# Patient Record
Sex: Female | Born: 1960 | Race: Black or African American | Hispanic: No | Smoking: Never smoker
Health system: Southern US, Community
[De-identification: ages and names within clinical notes are randomized; demographics above are authoritative.]

## PROBLEM LIST (undated history)

## (undated) DIAGNOSIS — D649 Anemia, unspecified: Secondary | ICD-10-CM

## (undated) DIAGNOSIS — L0390 Cellulitis, unspecified: Secondary | ICD-10-CM

## (undated) DIAGNOSIS — A4902 Methicillin resistant Staphylococcus aureus infection, unspecified site: Secondary | ICD-10-CM

## (undated) DIAGNOSIS — E079 Disorder of thyroid, unspecified: Secondary | ICD-10-CM

## (undated) DIAGNOSIS — G2581 Restless legs syndrome: Secondary | ICD-10-CM

## (undated) DIAGNOSIS — M797 Fibromyalgia: Secondary | ICD-10-CM

## (undated) DIAGNOSIS — I10 Essential (primary) hypertension: Secondary | ICD-10-CM

## (undated) DIAGNOSIS — J45909 Unspecified asthma, uncomplicated: Secondary | ICD-10-CM

## (undated) DIAGNOSIS — G4730 Sleep apnea, unspecified: Secondary | ICD-10-CM

## (undated) DIAGNOSIS — E039 Hypothyroidism, unspecified: Secondary | ICD-10-CM

## (undated) DIAGNOSIS — J189 Pneumonia, unspecified organism: Secondary | ICD-10-CM

## (undated) DIAGNOSIS — Z9109 Other allergy status, other than to drugs and biological substances: Secondary | ICD-10-CM

## (undated) HISTORY — PX: JOINT REPLACEMENT: SHX530

## (undated) HISTORY — PX: GASTRIC BYPASS: SHX52

## (undated) HISTORY — PX: BACK SURGERY: SHX140

---

## 2006-05-24 ENCOUNTER — Other Ambulatory Visit: Admission: RE | Admit: 2006-05-24 | Discharge: 2006-05-24 | Payer: Self-pay | Admitting: Obstetrics and Gynecology

## 2006-06-21 ENCOUNTER — Encounter: Admission: RE | Admit: 2006-06-21 | Discharge: 2006-06-21 | Payer: Self-pay | Admitting: Obstetrics and Gynecology

## 2006-07-08 ENCOUNTER — Inpatient Hospital Stay (HOSPITAL_COMMUNITY): Admission: AD | Admit: 2006-07-08 | Discharge: 2006-07-08 | Payer: Self-pay | Admitting: Obstetrics and Gynecology

## 2006-10-24 ENCOUNTER — Encounter: Admission: RE | Admit: 2006-10-24 | Discharge: 2006-10-24 | Payer: Self-pay | Admitting: Orthopedic Surgery

## 2006-11-19 ENCOUNTER — Inpatient Hospital Stay (HOSPITAL_COMMUNITY): Admission: RE | Admit: 2006-11-19 | Discharge: 2006-11-21 | Payer: Self-pay | Admitting: Neurosurgery

## 2008-03-27 ENCOUNTER — Emergency Department (HOSPITAL_BASED_OUTPATIENT_CLINIC_OR_DEPARTMENT_OTHER): Admission: EM | Admit: 2008-03-27 | Discharge: 2008-03-27 | Payer: Self-pay | Admitting: Emergency Medicine

## 2010-05-09 ENCOUNTER — Encounter: Admission: RE | Admit: 2010-05-09 | Discharge: 2010-05-09 | Payer: Self-pay | Admitting: Internal Medicine

## 2010-05-19 ENCOUNTER — Encounter
Admission: RE | Admit: 2010-05-19 | Discharge: 2010-05-19 | Payer: Self-pay | Source: Home / Self Care | Attending: Internal Medicine | Admitting: Internal Medicine

## 2010-10-24 NOTE — Op Note (Signed)
NAMENORVELL, URESTE                ACCOUNT NO.:  [id number]   MEDICAL RECORD NO.:  [id number]          PATIENT TYPE:  INP   LOCATION:  2899                         FACILITY:  [facility]   PHYSICIAN:  Danae Orleans. Venetia Maxon, M.D.  DATE OF BIRTH:  1961-01-20   DATE OF PROCEDURE:  11/19/2006  DATE OF DISCHARGE:                               OPERATIVE REPORT   PREOPERATIVE DIAGNOSIS:  Herniated cervical disk with cervical  myelopathy, spondylosis with myelopathy, stenosis, cervical  radiculopathy C3-4, C4-5, C5-6 and C6-7 levels.   POSTOPERATIVE DIAGNOSIS:  Herniated cervical disk with cervical  myelopathy, spondylosis with myelopathy, stenosis, cervical  radiculopathy C3-4, C4-5, C5-6 and C6-7 levels.   PROCEDURE:  Anterior cervical decompression and fusion C3-4, C4-5, C5-6,  C6-7 levels with PEEK interbody cages, morcellized bone autograft and  Osteocell with the anterior cervical plate.   SURGEON:  Danae Orleans. Venetia Maxon, M.D.   ASSISTANT:  Georgiann Cocker, RN; Clydene Fake, M.D.   ANESTHESIA:  General endotracheal anesthesia.   BLOOD LOSS:  200 mL   COMPLICATIONS:  None.   DISPOSITION:  Recovery.   INDICATIONS:  Tonya Webster is a 50 year old woman with herniated  cervical disk and cervical myelopathy at C3-4, C4-5, C5-6, and C6-7  levels.  It was elected to take her surgery for anterior cervical  decompression and fusion at these affected levels.   PROCEDURE:  Tonya Webster was brought to the operating room.  Following  satisfactory uncomplicated induction of general endotracheal anesthesia  and placement of intravenous lines, she was placed in supine position on  the operating table.  Her neck was maintained in neutral alignment.  Her  anterior neck was then prepped and draped in the usual sterile fashion.  Area of planned incision was infiltrated with 0.25% Marcaine and 0.5%  lidocaine with 1:200,000 epinephrine.  Incision was made from the  anterior border of sternocleidomastoid muscle  to the midline at the  level of the carotid tubercle on the left side of midline.  This  incision was carried through platysma layer.  Subplatysmal dissection  was performed exposing the anterior border sternocleidomastoid muscle  using blunt dissection, the carotid sheath kept lateral and trachea and  esophagus kept medial exposing anterior cervical spine.  A bent spinal  needle was placed at what was felt to be the C3-4 and C4-5 levels and  this was confirmed on intraoperative x-ray.  Subsequently the longus  colli muscles were taken down from the anterior cervical spine from C3-  C7 bilaterally using electrocautery and Key elevator.  Self-retaining  shadow line retractors placed facilitate exposure.  Initially at C3-4,  C4-5 level, these levels were incised and disk material was removed in  piecemeal fashion and endplates were stripped of residual disk material.  Caspar distraction system was used and initially at each of C3-4, C4-5  levels.  The  interspaces were cleared of residual disk material.  The  endplates were drilled with high-speed drill and the bone removed along  with the anterior osteophytes.  This was kept for later use with bone  grafting.  Subsequently a large amount of  central disk material,  herniated disk material was removed at the C3-4 level.  The central  spinal cord dura was decompressed bilaterally in cephalocaudal fashion  and appeared to be significant decompression of spinal cord.  This was  done under microscopic visualization.  Subsequently after hemostasis was  assured with Gelfoam soaked thrombin and after trial sizing a 6-mm  medium PEEK interbody cage was selected, packed with morselized bone  autograft and Osteocell, inserted interspace and countersunk  appropriately.  Attention was then turned to the C4-5 level similar  decompression was performed.  Central spinal cord dura was decompressed  as were the neural foramina bilaterally.  Hemostasis was  again assured  and a similarly sized PEEK cage was packed with morselized bone  autograft and Osteocell inserted interspace countersunk appropriately.  Attention was then turned to the C6-7 level where similar decompression  was performed.  Large uncinate spurs were drilled down and both C7 nerve  roots as well as central spinal cord dura was decompressed.  Hemostasis  was assured and similarly sized interbody cage was selected, packed with  morcellized bone autograft, Osteocell inserted interspace countersunk  appropriately.  At the C5-6 level there was a significant amount of cord  compression on the left side of midline by large osteophyte.  These were  drilled down and thinned and then elevated with a variety of curettes as  well as 1 mm gold tip Kerrison rongeurs.  Both the central spinal cord  dura, both the C6 neural foramina and also the were decompressed and the  dura appeared to rebound nicely following removal of these large  adherent bone spurs.  After hemostasis assured a similarly sized graft  was packed with morcellized bone autograft, Osteocell inserted in the  interspace and countersunk appropriately.  A four level Trestle anterior  cervical plate was then affixed to the anterior cervical spine after  traction weight was removed.  Two 14 mm variable angle screws placed at  C3, two at C4, two at C5, two at C6, two at C7, all screws had excellent  purchase.  Locking mechanisms were engaged.  Final x-ray demonstrated  the upper aspect of the construct because the patient's large body  habitus.  Hemostasis was assured.  The wound was irrigated.  A #7 Blake  drain was placed through separate stab incision.  The platysma was  anchored with nylon stitch.  The platysmal layer was closed with 3-0  Vicryl sutures.  Skin edges were approximated 3-0 Vicryl interrupted  inverted sutures.  Wound was dressed with Benzoin, Steri-Strips, Telfa, gauze and tape.  The patient was extubated in  the operating room, taken  to the recovery room in stable satisfactory condition, having tolerated  the operation well.  Counts correct at end the case.      Danae Orleans. Venetia Maxon, M.D.  Electronically Signed     JDS/MEDQ  D:  11/19/2006  T:  11/20/2006  Job:  [id number]

## 2010-11-22 ENCOUNTER — Other Ambulatory Visit (HOSPITAL_COMMUNITY)
Admission: RE | Admit: 2010-11-22 | Discharge: 2010-11-22 | Disposition: A | Discharge status: Home / Self Care | Payer: 59 | Source: Ambulatory Visit | Attending: Family Medicine | Admitting: Family Medicine

## 2010-11-22 DIAGNOSIS — Z124 Encounter for screening for malignant neoplasm of cervix: Secondary | ICD-10-CM | POA: Insufficient documentation

## 2011-03-13 LAB — URINALYSIS, ROUTINE W REFLEX MICROSCOPIC
Bilirubin Urine: NEGATIVE
Glucose, UA: NEGATIVE
Ketones, ur: NEGATIVE
Nitrite: NEGATIVE
Protein, ur: NEGATIVE
Specific Gravity, Urine: 1.007
Urobilinogen, UA: 0.2
pH: 6.5

## 2011-03-13 LAB — DIFFERENTIAL
Basophils Absolute: 0.2 — ABNORMAL HIGH
Basophils Relative: 2 — ABNORMAL HIGH
Eosinophils Absolute: 0.1
Monocytes Absolute: 0.6
Monocytes Relative: 5
Neutrophils Relative %: 77

## 2011-03-13 LAB — COMPREHENSIVE METABOLIC PANEL
ALT: <6
Albumin: 4.1
Alkaline Phosphatase: 113
Chloride: 108
Glucose, Bld: 79
Potassium: 3.9
Sodium: 142
Total Bilirubin: 0.3
Total Protein: 6.9

## 2011-03-13 LAB — GLUCOSE, CAPILLARY: Glucose-Capillary: 85

## 2011-03-13 LAB — PROTIME-INR
INR: 1.1
Prothrombin Time: 14.7

## 2011-03-13 LAB — CBC
HCT: 36.7
Hemoglobin: 11.9 — ABNORMAL LOW
MCHC: 32.5
MCV: 88.3
Platelets: 242
RBC: 4.15
RDW: 13.2
WBC: 11.4 — ABNORMAL HIGH

## 2011-03-13 LAB — POCT CARDIAC MARKERS
CKMB, poc: 1.8
Troponin i, poc: <0.05

## 2011-03-13 LAB — URINE MICROSCOPIC-ADD ON

## 2011-03-13 LAB — PREGNANCY, URINE: Preg Test, Ur: NEGATIVE

## 2011-03-13 LAB — LIPASE, BLOOD: Lipase: 98

## 2011-03-13 LAB — APTT: aPTT: 36

## 2011-03-29 LAB — BASIC METABOLIC PANEL
BUN: 8
CO2: 29
Calcium: 9
Creatinine, Ser: 0.67
Glucose, Bld: 92

## 2011-03-29 LAB — CBC
Hemoglobin: 12.6
MCHC: 33.3
RDW: 13.4

## 2011-12-19 ENCOUNTER — Other Ambulatory Visit: Payer: Self-pay | Admitting: Physician Assistant

## 2011-12-19 ENCOUNTER — Other Ambulatory Visit: Payer: Self-pay | Admitting: Family Medicine

## 2011-12-19 DIAGNOSIS — Z1231 Encounter for screening mammogram for malignant neoplasm of breast: Secondary | ICD-10-CM

## 2011-12-24 ENCOUNTER — Ambulatory Visit
Admission: RE | Admit: 2011-12-24 | Discharge: 2011-12-24 | Disposition: A | Discharge status: Home / Self Care | Payer: 59 | Source: Ambulatory Visit | Attending: Family Medicine | Admitting: Family Medicine

## 2011-12-24 DIAGNOSIS — Z1231 Encounter for screening mammogram for malignant neoplasm of breast: Secondary | ICD-10-CM

## 2012-07-11 ENCOUNTER — Other Ambulatory Visit: Payer: Self-pay | Admitting: Physician Assistant

## 2012-07-11 ENCOUNTER — Other Ambulatory Visit (HOSPITAL_COMMUNITY)
Admission: RE | Admit: 2012-07-11 | Discharge: 2012-07-11 | Disposition: A | Discharge status: Home / Self Care | Payer: 59 | Source: Ambulatory Visit | Attending: Family Medicine | Admitting: Family Medicine

## 2012-07-11 DIAGNOSIS — Z124 Encounter for screening for malignant neoplasm of cervix: Secondary | ICD-10-CM | POA: Insufficient documentation

## 2013-07-20 ENCOUNTER — Other Ambulatory Visit: Payer: Self-pay | Admitting: Physician Assistant

## 2013-07-20 ENCOUNTER — Other Ambulatory Visit (HOSPITAL_COMMUNITY)
Admission: RE | Admit: 2013-07-20 | Discharge: 2013-07-20 | Disposition: A | Discharge status: Home / Self Care | Payer: 59 | Source: Ambulatory Visit | Attending: Family Medicine | Admitting: Family Medicine

## 2013-07-20 DIAGNOSIS — Z124 Encounter for screening for malignant neoplasm of cervix: Secondary | ICD-10-CM | POA: Insufficient documentation

## 2013-08-10 ENCOUNTER — Other Ambulatory Visit: Payer: Self-pay

## 2013-08-10 DIAGNOSIS — Z1231 Encounter for screening mammogram for malignant neoplasm of breast: Secondary | ICD-10-CM

## 2013-08-11 ENCOUNTER — Ambulatory Visit
Admission: RE | Admit: 2013-08-11 | Discharge: 2013-08-11 | Disposition: A | Discharge status: Home / Self Care | Payer: 59 | Source: Ambulatory Visit

## 2013-08-11 DIAGNOSIS — Z1231 Encounter for screening mammogram for malignant neoplasm of breast: Secondary | ICD-10-CM

## 2014-03-31 ENCOUNTER — Encounter (HOSPITAL_BASED_OUTPATIENT_CLINIC_OR_DEPARTMENT_OTHER): Payer: Self-pay | Admitting: Emergency Medicine

## 2014-03-31 ENCOUNTER — Emergency Department (HOSPITAL_BASED_OUTPATIENT_CLINIC_OR_DEPARTMENT_OTHER)
Admission: EM | Admit: 2014-03-31 | Discharge: 2014-03-31 | Disposition: A | Discharge status: Home / Self Care | Payer: 59 | Attending: Emergency Medicine | Admitting: Emergency Medicine

## 2014-03-31 DIAGNOSIS — J45909 Unspecified asthma, uncomplicated: Secondary | ICD-10-CM | POA: Diagnosis not present

## 2014-03-31 DIAGNOSIS — Z7951 Long term (current) use of inhaled steroids: Secondary | ICD-10-CM | POA: Diagnosis not present

## 2014-03-31 DIAGNOSIS — L03115 Cellulitis of right lower limb: Secondary | ICD-10-CM | POA: Diagnosis not present

## 2014-03-31 DIAGNOSIS — Z79899 Other long term (current) drug therapy: Secondary | ICD-10-CM | POA: Diagnosis not present

## 2014-03-31 DIAGNOSIS — L089 Local infection of the skin and subcutaneous tissue, unspecified: Secondary | ICD-10-CM | POA: Diagnosis present

## 2014-03-31 DIAGNOSIS — E079 Disorder of thyroid, unspecified: Secondary | ICD-10-CM | POA: Insufficient documentation

## 2014-03-31 DIAGNOSIS — Z9884 Bariatric surgery status: Secondary | ICD-10-CM | POA: Insufficient documentation

## 2014-03-31 DIAGNOSIS — Z8614 Personal history of Methicillin resistant Staphylococcus aureus infection: Secondary | ICD-10-CM | POA: Insufficient documentation

## 2014-03-31 DIAGNOSIS — M797 Fibromyalgia: Secondary | ICD-10-CM | POA: Diagnosis not present

## 2014-03-31 DIAGNOSIS — R509 Fever, unspecified: Secondary | ICD-10-CM | POA: Insufficient documentation

## 2014-03-31 HISTORY — DX: Fibromyalgia: M79.7

## 2014-03-31 HISTORY — DX: Disorder of thyroid, unspecified: E07.9

## 2014-03-31 HISTORY — DX: Unspecified asthma, uncomplicated: J45.909

## 2014-03-31 HISTORY — DX: Other allergy status, other than to drugs and biological substances: Z91.09

## 2014-03-31 HISTORY — DX: Methicillin resistant Staphylococcus aureus infection, unspecified site: A49.02

## 2014-03-31 MED ORDER — CLINDAMYCIN HCL 150 MG PO CAPS: 450.0000 mg | ORAL_CAPSULE | Freq: Three times a day (TID) | ORAL | Status: DC

## 2014-03-31 MED ORDER — CLINDAMYCIN HCL 150 MG PO CAPS
450.0000 mg | ORAL_CAPSULE | Freq: Once | ORAL | Status: AC
  Administered 2014-03-31: 450 mg via ORAL
  Filled 2014-03-31: qty 3

## 2014-03-31 NOTE — ED Provider Notes (Signed)
CSN: [id number]     Arrival date & time 03/31/14  1948 History  This chart was scribed for Tonya Delay, MD by Randa Evens, ED Scribe. This patient was seen in room MH06/MH06 and the patient's care was started at 8:11 PM.      Chief Complaint  Patient presents with  . Wound Infection   Patient is a 53 y.o. female presenting with wound check. The history is provided by the patient. No language interpreter was used.  Wound Check This is a recurrent problem. The current episode started more than 2 days ago. The problem occurs rarely. The problem has been gradually worsening. Nothing aggravates the symptoms. Nothing relieves the symptoms. She has tried a warm compress for the symptoms.   HPI Comments: Tonya Webster is a 53 y.o. female who presents to the Emergency Department complaining of wound infection to the back of her right thigh onset 5-6 days ago. She states she has had associated subjective fever, chills, color change and swelling and discharge. She states she has had nausea and diarrhea that has now resolved. She states she has tried applying warm compress and it has helped. Denies abdominal pain or other related symptoms.   Past Medical History  Diagnosis Date  . MRSA infection   . Fibromyalgia   . Thyroid disease   . Asthma   . Environmental allergies    Past Surgical History  Procedure Laterality Date  . Back surgery    . Gastric bypass    . Cesarean section     No family history on file. History  Substance Use Topics  . Smoking status: Never Smoker   . Smokeless tobacco: Not on file  . Alcohol Use: No   OB History   Grav Para Term Preterm Abortions TAB SAB Ect Mult Living                 Review of Systems  All other systems reviewed and are negative.     Allergies  Soy allergy  Home Medications   Prior to Admission medications   Medication Sig Start Date End Date Taking? Authorizing Provider  acetaminophen (TYLENOL) 500 MG tablet Take 500 mg by  mouth every 6 (six) hours as needed.   Yes Historical Provider, MD  Albuterol (VENTOLIN IN) Inhale into the lungs.   Yes Historical Provider, MD  BuPROPion HCl (WELLBUTRIN PO) Take by mouth.   Yes Historical Provider, MD  Cetirizine HCl (ZYRTEC PO) Take by mouth.   Yes Historical Provider, MD  Fluticasone Propionate (FLONASE NA) Place into the nose.   Yes Historical Provider, MD  GABAPENTIN PO Take by mouth.   Yes Historical Provider, MD  LEVOTHYROXINE SODIUM PO Take by mouth.   Yes Historical Provider, MD  OXYBUTYNIN CHLORIDE PO Take by mouth.   Yes Historical Provider, MD   Triage Vitals: BP 146/72  Pulse 80  Temp(Src) 98 F (36.7 C) (Oral)  Resp 20  Ht 5\' 4"  (1.626 m)  Wt 230 lb (104.327 kg)  BMI 39.46 kg/m2  SpO2 99%  Physical Exam  Nursing note and vitals reviewed. Constitutional: She is oriented to person, place, and time. She appears well-developed and well-nourished. No distress.  HENT:  Head: Normocephalic and atraumatic.  Eyes: Conjunctivae are normal. No scleral icterus.  Neck: Neck supple.  Cardiovascular: Normal rate and intact distal pulses.   Pulmonary/Chest: Effort normal. No stridor. No respiratory distress.  Abdominal: Normal appearance. She exhibits no distension.  Neurological: She is alert and oriented  to person, place, and time.  Skin: Skin is warm and dry. No rash noted.     Psychiatric: She has a normal mood and affect. Her behavior is normal.    ED Course  Procedures (including critical care time)  EMERGENCY DEPARTMENT US SOFT TISSUE INTERPRETATION "Study: Limited Soft Tissue Ultrasound"  INDICATIONS: Soft tissue infection Multiple views of the body part were obtained in real-time with a multi-frequency linear probe PERFORMED BY:  Myself IMAGES ARCHIVED?: Yes SIDE:Right  BODY PART:Lower extremity FINDINGS: No abcess noted and Cellulitis present INTERPRETATION:  No abcess noted and Cellulitis present     DIAGNOSTIC STUDIES: Oxygen  Saturation is 99% on RA, normal by my interpretation.    COORDINATION OF CARE: 8:46 PM-Discussed treatment plan which includes antibiotics with pt at bedside and pt agreed to plan.     Labs Review Labs Reviewed - No data to display  Imaging Review No results found.   EKG Interpretation None      MDM   Final diagnoses:  Cellulitis of right leg      53 yo female with a history of recurrent skin infections who presents with a sore on the back of her right leg. Has been spontaneously draining since she started using warm compresses. On exam, large ulceration without active drainage. Bedside ultrasound was used to confirm no underlying fluid collection. It does not appear that the wound needs to be opened further. She does however need antibiotics. Have started clindamycin, which she has tolerated in the past. She is nontoxic, not septic appearing.  I personally performed the services described in this documentation, which was scribed in my presence. The recorded information has been reviewed and is accurate.      Tonya Delay, MD 03/31/14 2109

## 2014-03-31 NOTE — ED Notes (Signed)
Dressing to right posterior abscess, covered with 4 x 4, wrapped with kerlix and  Secured with coban

## 2014-03-31 NOTE — Discharge Instructions (Signed)

## 2014-03-31 NOTE — ED Notes (Signed)
Pt states "i got a wound on the back of my leg" x 5-6 days

## 2014-03-31 NOTE — ED Notes (Signed)
Ultrasound placed in room

## 2014-08-05 ENCOUNTER — Other Ambulatory Visit (HOSPITAL_COMMUNITY)
Admission: RE | Admit: 2014-08-05 | Discharge: 2014-08-05 | Disposition: A | Discharge status: Home / Self Care | Payer: 59 | Source: Ambulatory Visit | Attending: Family Medicine | Admitting: Family Medicine

## 2014-08-05 ENCOUNTER — Other Ambulatory Visit: Payer: Self-pay | Admitting: Physician Assistant

## 2014-08-05 DIAGNOSIS — Z124 Encounter for screening for malignant neoplasm of cervix: Secondary | ICD-10-CM | POA: Diagnosis present

## 2014-08-06 LAB — CYTOLOGY - PAP

## 2014-09-01 ENCOUNTER — Other Ambulatory Visit: Payer: Self-pay

## 2014-09-01 DIAGNOSIS — Z1231 Encounter for screening mammogram for malignant neoplasm of breast: Secondary | ICD-10-CM

## 2014-09-03 ENCOUNTER — Ambulatory Visit
Admission: RE | Admit: 2014-09-03 | Discharge: 2014-09-03 | Disposition: A | Discharge status: Home / Self Care | Payer: 59 | Source: Ambulatory Visit

## 2014-09-03 DIAGNOSIS — Z1231 Encounter for screening mammogram for malignant neoplasm of breast: Secondary | ICD-10-CM

## 2015-01-21 ENCOUNTER — Emergency Department (HOSPITAL_COMMUNITY)
Admission: EM | Admit: 2015-01-21 | Discharge: 2015-01-22 | Disposition: A | Discharge status: Home / Self Care | Payer: 59 | Attending: Emergency Medicine | Admitting: Emergency Medicine

## 2015-01-21 ENCOUNTER — Encounter (HOSPITAL_COMMUNITY): Payer: Self-pay | Admitting: *Deleted

## 2015-01-21 DIAGNOSIS — Y998 Other external cause status: Secondary | ICD-10-CM | POA: Diagnosis not present

## 2015-01-21 DIAGNOSIS — Y9289 Other specified places as the place of occurrence of the external cause: Secondary | ICD-10-CM | POA: Diagnosis not present

## 2015-01-21 DIAGNOSIS — Y9389 Activity, other specified: Secondary | ICD-10-CM | POA: Insufficient documentation

## 2015-01-21 DIAGNOSIS — M797 Fibromyalgia: Secondary | ICD-10-CM | POA: Diagnosis not present

## 2015-01-21 DIAGNOSIS — J45909 Unspecified asthma, uncomplicated: Secondary | ICD-10-CM | POA: Insufficient documentation

## 2015-01-21 DIAGNOSIS — Z79899 Other long term (current) drug therapy: Secondary | ICD-10-CM | POA: Diagnosis not present

## 2015-01-21 DIAGNOSIS — E079 Disorder of thyroid, unspecified: Secondary | ICD-10-CM | POA: Diagnosis not present

## 2015-01-21 DIAGNOSIS — Z8614 Personal history of Methicillin resistant Staphylococcus aureus infection: Secondary | ICD-10-CM | POA: Insufficient documentation

## 2015-01-21 DIAGNOSIS — T781XXA Other adverse food reactions, not elsewhere classified, initial encounter: Secondary | ICD-10-CM | POA: Diagnosis present

## 2015-01-21 DIAGNOSIS — X58XXXA Exposure to other specified factors, initial encounter: Secondary | ICD-10-CM | POA: Diagnosis not present

## 2015-01-21 DIAGNOSIS — T7840XA Allergy, unspecified, initial encounter: Secondary | ICD-10-CM

## 2015-01-21 MED ORDER — FAMOTIDINE IN NACL 20-0.9 MG/50ML-% IV SOLN
20.0000 mg | Freq: Once | INTRAVENOUS | Status: AC
  Administered 2015-01-21: 20 mg via INTRAVENOUS
  Filled 2015-01-21: qty 50

## 2015-01-21 MED ORDER — METHYLPREDNISOLONE SODIUM SUCC 125 MG IJ SOLR
125.0000 mg | Freq: Once | INTRAMUSCULAR | Status: AC
  Administered 2015-01-21: 125 mg via INTRAVENOUS
  Filled 2015-01-21: qty 2

## 2015-01-21 MED ORDER — DIPHENHYDRAMINE HCL 50 MG/ML IJ SOLN
25.0000 mg | Freq: Once | INTRAMUSCULAR | Status: AC
  Administered 2015-01-21: 25 mg via INTRAVENOUS
  Filled 2015-01-21: qty 1

## 2015-01-21 NOTE — ED Notes (Signed)
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  She has had a swollen upper lip since 1700. She took 50mg  of benadryl approx 1900.   Rt sided upper lip  Initially and now she has swelling to the lt upper  Lip on the lt and up into her lt face.

## 2015-01-21 NOTE — ED Provider Notes (Signed)
CSN: [id number]     Arrival date & time 01/21/15  2143 History  This chart was scribed for Delora Fuel, MD by Eustaquio Maize, ED Scribe. This patient was seen in room B19C/B19C and the patient's care was started at 11:08 PM.  Chief Complaint  Patient presents with  . Allergic Reaction   The history is provided by the patient. No language interpreter was used.     HPI Comments: Tonya Webster is a 54 y.o. female who presents to the Emergency Department complaining of allergic reaction s/p eating eggs and cookies earlier tonight. Pt states that she was eating eggs when she states the fork was irritating her lip. She decided to wash her mouth out with peroxide and then proceeded to eat cookies. She notes swelling to her upper lip immediately after eating the cookies. Pt took Benadryl without relief, prompting her to come to the ED. She also notes an itching sensation to her back and bilateral arms. Denies difficulty breathing, difficulty swallowing, rash, or any other associated symptoms.   Past Medical History  Diagnosis Date  . MRSA infection   . Fibromyalgia   . Thyroid disease   . Asthma   . Environmental allergies    Past Surgical History  Procedure Laterality Date  . Back surgery    . Gastric bypass    . Cesarean section     No family history on file. Social History  Substance Use Topics  . Smoking status: Never Smoker   . Smokeless tobacco: None  . Alcohol Use: No   OB History    No data available     Review of Systems  HENT: Positive for facial swelling. Negative for trouble swallowing.   Respiratory: Negative for chest tightness, shortness of breath and wheezing.   Skin: Negative for rash.  All other systems reviewed and are negative.  Allergies  Soy allergy  Home Medications   Prior to Admission medications   Medication Sig Start Date End Date Taking? Authorizing Provider  acetaminophen (TYLENOL) 500 MG tablet Take 500 mg by mouth every 6 (six) hours as  needed.    Historical Provider, MD  Albuterol (VENTOLIN IN) Inhale into the lungs.    Historical Provider, MD  BuPROPion HCl (WELLBUTRIN PO) Take by mouth.    Historical Provider, MD  Cetirizine HCl (ZYRTEC PO) Take by mouth.    Historical Provider, MD  clindamycin (CLEOCIN) 150 MG capsule Take 3 capsules (450 mg total) by mouth 3 (three) times daily. 03/31/14   Serita Grit, MD  Fluticasone Propionate (FLONASE NA) Place into the nose.    Historical Provider, MD  GABAPENTIN PO Take by mouth.    Historical Provider, MD  LEVOTHYROXINE SODIUM PO Take by mouth.    Historical Provider, MD  OXYBUTYNIN CHLORIDE PO Take by mouth.    Historical Provider, MD   Triage Vitals: BP 184/46 mmHg  Pulse 78  Temp(Src) 98.7 F (37.1 C) (Oral)  Resp 12  Ht 5\' 4"  (1.626 m)  Wt 248 lb 1 oz (112.52 kg)  BMI 42.56 kg/m2  SpO2 98%   Physical Exam  Constitutional: She is oriented to person, place, and time. She appears well-developed and well-nourished. No distress.  HENT:  Head: Normocephalic and atraumatic.  Swelling of the right side of the upper lip and across the lower lip  No swelling of the tongue, sublingual tissue, or oropharynx No difficulty with secretions Normal phonation  Eyes: Conjunctivae and EOM are normal. Pupils are equal, round, and reactive  to light.  Neck: Normal range of motion. Neck supple. No JVD present.  Cardiovascular: Normal rate, regular rhythm and normal heart sounds.   No murmur heard. Pulmonary/Chest: Effort normal and breath sounds normal. She has no wheezes. She has no rales. She exhibits no tenderness.  Abdominal: Soft. Bowel sounds are normal. She exhibits no distension and no mass. There is no tenderness.  Musculoskeletal: Normal range of motion. She exhibits no edema or tenderness.  Lymphadenopathy:    She has no cervical adenopathy.  Neurological: She is alert and oriented to person, place, and time. No cranial nerve deficit. She exhibits normal muscle tone.  Coordination normal.  Skin: Skin is warm and dry. No rash noted.  Psychiatric: She has a normal mood and affect. Her behavior is normal. Judgment and thought content normal.  Nursing note and vitals reviewed.   ED Course  Procedures (including critical care time)  DIAGNOSTIC STUDIES: Oxygen Saturation is 98% on RA, normal by my interpretation.    COORDINATION OF CARE: 11:14 PM-Discussed treatment plan which includes Benadryl, Pepcid, and Solumedrol in ED with pt at bedside and pt agreed to plan.    MDM   Final diagnoses:  Allergic reaction, initial encounter    Apparent allergic reaction. Although the only swelling is seen to the lips, generalized itching would argue against angioedema. There is no respiratory compromise. She will be treated with diphenhydramine, famotidine, and methylprednisolone and observed in the ED. At this point, no indication for epinephrine.   She had good relief of symptoms with above noted treatment. She was observed in the ED and lip swelling went down significantly but not to baseline. She is advised to use over-the-counter nonsedating any histamines and use Benadryl as needed for breakthrough itching. She is given a prescription for prednisone.  I personally performed the services described in this documentation, which was scribed in my presence. The recorded information has been reviewed and is accurate.       Delora Fuel, MD 85/92/92 [id number]

## 2015-01-21 NOTE — ED Notes (Signed)
Patient presents stating she is having an allergic reaction.  Top lip swollen and states her face is feeling tight.  Took 2 Benadryl at home to "help slow it down"

## 2015-01-21 NOTE — ED Notes (Signed)
She is also c/o itching

## 2015-01-22 MED ORDER — DIPHENHYDRAMINE HCL 50 MG/ML IJ SOLN
25.0000 mg | Freq: Once | INTRAMUSCULAR | Status: AC
  Administered 2015-01-22: 25 mg via INTRAVENOUS
  Filled 2015-01-22: qty 1

## 2015-01-22 MED ORDER — PREDNISONE 50 MG PO TABS: 50.0000 mg | ORAL_TABLET | Freq: Every day | ORAL | Status: DC

## 2015-01-22 MED ORDER — LORAZEPAM 1 MG PO TABS: 1.0000 mg | ORAL_TABLET | Freq: Four times a day (QID) | ORAL | Status: DC | PRN

## 2015-01-22 MED ORDER — LORAZEPAM 2 MG/ML IJ SOLN
1.0000 mg | Freq: Once | INTRAMUSCULAR | Status: AC
  Administered 2015-01-22: 1 mg via INTRAVENOUS
  Filled 2015-01-22: qty 1

## 2015-01-22 NOTE — ED Notes (Signed)
Dr. Glick at bedside.  

## 2015-01-22 NOTE — ED Notes (Signed)
Pt verbalized understanding of d/c instructions and has no further questions. Pt stable and NAD.  

## 2015-01-22 NOTE — Discharge Instructions (Signed)
Take Zyrtec 10 mg once a day. Take diphenhydramine (Benadryl) as needed for itching not relieved by Zyrtec.   Allergies Allergies may happen from anything your body is sensitive to. This may be food, medicines, pollens, chemicals, and nearly anything around you in everyday life that produces allergens. An allergen is anything that causes an allergy producing substance. Heredity is often a factor in causing these problems. This means you may have some of the same allergies as your parents. Food allergies happen in all age groups. Food allergies are some of the most severe and life threatening. Some common food allergies are cow's milk, seafood, eggs, nuts, wheat, and soybeans. SYMPTOMS   Swelling around the mouth.  An itchy red rash or hives.  Vomiting or diarrhea.  Difficulty breathing. SEVERE ALLERGIC REACTIONS ARE LIFE-THREATENING. This reaction is called anaphylaxis. It can cause the mouth and throat to swell and cause difficulty with breathing and swallowing. In severe reactions only a trace amount of food (for example, peanut oil in a salad) may cause death within seconds. Seasonal allergies occur in all age groups. These are seasonal because they usually occur during the same season every year. They may be a reaction to molds, grass pollens, or tree pollens. Other causes of problems are house dust mite allergens, pet dander, and mold spores. The symptoms often consist of nasal congestion, a runny itchy nose associated with sneezing, and tearing itchy eyes. There is often an associated itching of the mouth and ears. The problems happen when you come in contact with pollens and other allergens. Allergens are the particles in the air that the body reacts to with an allergic reaction. This causes you to release allergic antibodies. Through a chain of events, these eventually cause you to release histamine into the blood stream. Although it is meant to be protective to the body, it is this release  that causes your discomfort. This is why you were given anti-histamines to feel better. If you are unable to pinpoint the offending allergen, it may be determined by skin or blood testing. Allergies cannot be cured but can be controlled with medicine. Hay fever is a collection of all or some of the seasonal allergy problems. It may often be treated with simple over-the-counter medicine such as diphenhydramine. Take medicine as directed. Do not drink alcohol or drive while taking this medicine. Check with your caregiver or package insert for child dosages. If these medicines are not effective, there are many new medicines your caregiver can prescribe. Stronger medicine such as nasal spray, eye drops, and corticosteroids may be used if the first things you try do not work well. Other treatments such as immunotherapy or desensitizing injections can be used if all else fails. Follow up with your caregiver if problems continue. These seasonal allergies are usually not life threatening. They are generally more of a nuisance that can often be handled using medicine. HOME CARE INSTRUCTIONS   If unsure what causes a reaction, keep a diary of foods eaten and symptoms that follow. Avoid foods that cause reactions.  If hives or rash are present:  Take medicine as directed.  You may use an over-the-counter antihistamine (diphenhydramine) for hives and itching as needed.  Apply cold compresses (cloths) to the skin or take baths in cool water. Avoid hot baths or showers. Heat will make a rash and itching worse.  If you are severely allergic:  Following a treatment for a severe reaction, hospitalization is often required for closer follow-up.  Wear a  medic-alert bracelet or necklace stating the allergy.  You and your family must learn how to give adrenaline or use an anaphylaxis kit.  If you have had a severe reaction, always carry your anaphylaxis kit or EpiPen with you. Use this medicine as directed by  your caregiver if a severe reaction is occurring. Failure to do so could have a fatal outcome. SEEK MEDICAL CARE IF:  You suspect a food allergy. Symptoms generally happen within 30 minutes of eating a food.  Your symptoms have not gone away within 2 days or are getting worse.  You develop new symptoms.  You want to retest yourself or your child with a food or drink you think causes an allergic reaction. Never do this if an anaphylactic reaction to that food or drink has happened before. Only do this under the care of a caregiver. SEEK IMMEDIATE MEDICAL CARE IF:   You have difficulty breathing, are wheezing, or have a tight feeling in your chest or throat.  You have a swollen mouth, or you have hives, swelling, or itching all over your body.  You have had a severe reaction that has responded to your anaphylaxis kit or an EpiPen. These reactions may return when the medicine has worn off. These reactions should be considered life threatening. MAKE SURE YOU:   Understand these instructions.  Will watch your condition.  Will get help right away if you are not doing well or get worse. Document Released: 08/21/2002 Document Revised: 09/22/2012 Document Reviewed: 01/26/2008 [facility] Patient Information 2015 [place], [place]. This information is not intended to replace advice given to you by your health care provider. Make sure you discuss any questions you have with your health care provider.  Prednisone tablets What is this medicine? PREDNISONE (PRED ni sone) is a corticosteroid. It is commonly used to treat inflammation of the skin, joints, lungs, and other organs. Common conditions treated include asthma, allergies, and arthritis. It is also used for other conditions, such as blood disorders and diseases of the adrenal glands. This medicine may be used for other purposes; ask your health care provider or pharmacist if you have questions. COMMON BRAND NAME(S): Deltasone, Predone, Sterapred,  Sterapred DS What should I tell my health care provider before I take this medicine? They need to know if you have any of these conditions: -Cushing's syndrome -diabetes -glaucoma -heart disease -high blood pressure -infection (especially a virus infection such as chickenpox, cold sores, or herpes) -kidney disease -liver disease -mental illness -myasthenia gravis -osteoporosis -seizures -stomach or intestine problems -thyroid disease -an unusual or allergic reaction to lactose, prednisone, other medicines, foods, dyes, or preservatives -pregnant or trying to get pregnant -breast-feeding How should I use this medicine? Take this medicine by mouth with a glass of water. Follow the directions on the prescription label. Take this medicine with food. If you are taking this medicine once a day, take it in the morning. Do not take more medicine than you are told to take. Do not suddenly stop taking your medicine because you may develop a severe reaction. Your doctor will tell you how much medicine to take. If your doctor wants you to stop the medicine, the dose may be slowly lowered over time to avoid any side effects. Talk to your pediatrician regarding the use of this medicine in children. Special care may be needed. Overdosage: If you think you have taken too much of this medicine contact a poison control center or emergency room at once. NOTE: This medicine is only  for you. Do not share this medicine with others. What if I miss a dose? If you miss a dose, take it as soon as you can. If it is almost time for your next dose, talk to your doctor or health care professional. You may need to miss a dose or take an extra dose. Do not take double or extra doses without advice. What may interact with this medicine? Do not take this medicine with any of the following medications: -metyrapone -mifepristone This medicine may also interact with the following  medications: -aminoglutethimide -amphotericin B -aspirin and aspirin-like medicines -barbiturates -certain medicines for diabetes, like glipizide or glyburide -cholestyramine -cholinesterase inhibitors -cyclosporine -digoxin -diuretics -ephedrine -female hormones, like estrogens and birth control pills -isoniazid -ketoconazole -NSAIDS, medicines for pain and inflammation, like ibuprofen or naproxen -phenytoin -rifampin -toxoids -vaccines -warfarin This list may not describe all possible interactions. Give your health care provider a list of all the medicines, herbs, non-prescription drugs, or dietary supplements you use. Also tell them if you smoke, drink alcohol, or use illegal drugs. Some items may interact with your medicine. What should I watch for while using this medicine? Visit your doctor or health care professional for regular checks on your progress. If you are taking this medicine over a prolonged period, carry an identification card with your name and address, the type and dose of your medicine, and your doctor's name and address. This medicine may increase your risk of getting an infection. Tell your doctor or health care professional if you are around anyone with measles or chickenpox, or if you develop sores or blisters that do not heal properly. If you are going to have surgery, tell your doctor or health care professional that you have taken this medicine within the last twelve months. Ask your doctor or health care professional about your diet. You may need to lower the amount of salt you eat. This medicine may affect blood sugar levels. If you have diabetes, check with your doctor or health care professional before you change your diet or the dose of your diabetic medicine. What side effects may I notice from receiving this medicine? Side effects that you should report to your doctor or health care professional as soon as possible: -allergic reactions like skin rash,  itching or hives, swelling of the face, lips, or tongue -changes in emotions or moods -changes in vision -depressed mood -eye pain -fever or chills, cough, sore throat, pain or difficulty passing urine -increased thirst -swelling of ankles, feet Side effects that usually do not require medical attention (report to your doctor or health care professional if they continue or are bothersome): -confusion, excitement, restlessness -headache -nausea, vomiting -skin problems, acne, thin and shiny skin -trouble sleeping -weight gain This list may not describe all possible side effects. Call your doctor for medical advice about side effects. You may report side effects to FDA at 1-800-FDA-1088. Where should I keep my medicine? Keep out of the reach of children. Store at room temperature between 15 and 30 degrees C (59 and 86 degrees F). Protect from light. Keep container tightly closed. Throw away any unused medicine after the expiration date. NOTE: This sheet is a summary. It may not cover all possible information. If you have questions about this medicine, talk to your doctor, pharmacist, or health care provider.  2015, Elsevier/Gold Standard. (2011-01-11 10:57:14)  Lorazepam tablets What is this medicine? LORAZEPAM (lor A ze pam) is a benzodiazepine. It is used to treat anxiety. This medicine may be  used for other purposes; ask your health care provider or pharmacist if you have questions. COMMON BRAND NAME(S): Ativan What should I tell my health care provider before I take this medicine? They need to know if you have any of these conditions: -alcohol or drug abuse problem -bipolar disorder, depression, psychosis or other mental health condition -glaucoma -kidney or liver disease -lung disease or breathing difficulties -myasthenia gravis -Parkinson's disease -seizures or a history of seizures -suicidal thoughts -an unusual or allergic reaction to lorazepam, other benzodiazepines,  foods, dyes, or preservatives -pregnant or trying to get pregnant -breast-feeding How should I use this medicine? Take this medicine by mouth with a glass of water. Follow the directions on the prescription label. If it upsets your stomach, take it with food or milk. Take your medicine at regular intervals. Do not take it more often than directed. Do not stop taking except on the advice of your doctor or health care professional. Talk to your pediatrician regarding the use of this medicine in children. Special care may be needed. Overdosage: If you think you have taken too much of this medicine contact a poison control center or emergency room at once. NOTE: This medicine is only for you. Do not share this medicine with others. What if I miss a dose? If you miss a dose, take it as soon as you can. If it is almost time for your next dose, take only that dose. Do not take double or extra doses. What may interact with this medicine? -barbiturate medicines for inducing sleep or treating seizures, like phenobarbital -clozapine -medicines for depression, mental problems or psychiatric disturbances -medicines for sleep -phenytoin -probenecid -theophylline -valproic acid This list may not describe all possible interactions. Give your health care provider a list of all the medicines, herbs, non-prescription drugs, or dietary supplements you use. Also tell them if you smoke, drink alcohol, or use illegal drugs. Some items may interact with your medicine. What should I watch for while using this medicine? Visit your doctor or health care professional for regular checks on your progress. Your body may become dependent on this medicine, ask your doctor or health care professional if you still need to take it. However, if you have been taking this medicine regularly for some time, do not suddenly stop taking it. You must gradually reduce the dose or you may get severe side effects. Ask your doctor or health  care professional for advice before increasing or decreasing the dose. Even after you stop taking this medicine it can still affect your body for several days. You may get drowsy or dizzy. Do not drive, use machinery, or do anything that needs mental alertness until you know how this medicine affects you. To reduce the risk of dizzy and fainting spells, do not stand or sit up quickly, especially if you are an older patient. Alcohol may increase dizziness and drowsiness. Avoid alcoholic drinks. Do not treat yourself for coughs, colds or allergies without asking your doctor or health care professional for advice. Some ingredients can increase possible side effects. What side effects may I notice from receiving this medicine? Side effects that you should report to your doctor or health care professional as soon as possible: -changes in vision -confusion -depression -mood changes, excitability or aggressive behavior -movement difficulty, staggering or jerky movements -muscle cramps -restlessness -weakness or tiredness Side effects that usually do not require medical attention (report to your doctor or health care professional if they continue or are bothersome): -constipation or diarrhea -  difficulty sleeping, nightmares -dizziness, drowsiness -headache -nausea, vomiting This list may not describe all possible side effects. Call your doctor for medical advice about side effects. You may report side effects to FDA at 1-800-FDA-1088. Where should I keep my medicine? Keep out of the reach of children. This medicine can be abused. Keep your medicine in a safe place to protect it from theft. Do not share this medicine with anyone. Selling or giving away this medicine is dangerous and against the law. Store at room temperature between 20 and 25 degrees C (68 and 77 degrees F). Protect from light. Keep container tightly closed. Throw away any unused medicine after the expiration date. NOTE: This sheet is  a summary. It may not cover all possible information. If you have questions about this medicine, talk to your doctor, pharmacist, or health care provider.  2015, Elsevier/Gold Standard. (2007-11-28 14:58:20)

## 2015-11-04 ENCOUNTER — Other Ambulatory Visit: Payer: Self-pay

## 2015-11-04 DIAGNOSIS — Z1231 Encounter for screening mammogram for malignant neoplasm of breast: Secondary | ICD-10-CM

## 2015-11-17 ENCOUNTER — Other Ambulatory Visit: Payer: Self-pay | Admitting: Physician Assistant

## 2015-11-17 ENCOUNTER — Ambulatory Visit
Admission: RE | Admit: 2015-11-17 | Discharge: 2015-11-17 | Disposition: A | Discharge status: Home / Self Care | Payer: 59 | Source: Ambulatory Visit

## 2015-11-17 DIAGNOSIS — Z1231 Encounter for screening mammogram for malignant neoplasm of breast: Secondary | ICD-10-CM

## 2016-02-22 ENCOUNTER — Other Ambulatory Visit: Payer: Self-pay | Admitting: Orthopaedic Surgery

## 2016-02-22 DIAGNOSIS — M545 Low back pain: Secondary | ICD-10-CM

## 2016-03-02 ENCOUNTER — Ambulatory Visit
Admission: RE | Admit: 2016-03-02 | Discharge: 2016-03-02 | Disposition: A | Discharge status: Home / Self Care | Payer: 59 | Source: Ambulatory Visit | Attending: Orthopaedic Surgery | Admitting: Orthopaedic Surgery

## 2016-03-02 DIAGNOSIS — M545 Low back pain: Secondary | ICD-10-CM

## 2016-03-15 ENCOUNTER — Ambulatory Visit (INDEPENDENT_AMBULATORY_CARE_PROVIDER_SITE_OTHER): Payer: 59 | Admitting: Physical Medicine and Rehabilitation

## 2016-03-15 DIAGNOSIS — M47816 Spondylosis without myelopathy or radiculopathy, lumbar region: Secondary | ICD-10-CM | POA: Diagnosis not present

## 2016-03-15 DIAGNOSIS — M5416 Radiculopathy, lumbar region: Secondary | ICD-10-CM | POA: Diagnosis not present

## 2016-03-21 ENCOUNTER — Encounter (INDEPENDENT_AMBULATORY_CARE_PROVIDER_SITE_OTHER): Payer: 59 | Admitting: Physical Medicine and Rehabilitation

## 2016-03-21 DIAGNOSIS — M5416 Radiculopathy, lumbar region: Secondary | ICD-10-CM | POA: Diagnosis not present

## 2016-08-14 DIAGNOSIS — M6530 Trigger finger, unspecified finger: Secondary | ICD-10-CM | POA: Insufficient documentation

## 2017-02-15 ENCOUNTER — Ambulatory Visit (INDEPENDENT_AMBULATORY_CARE_PROVIDER_SITE_OTHER): Payer: 59 | Admitting: Orthopaedic Surgery

## 2017-02-15 ENCOUNTER — Encounter (INDEPENDENT_AMBULATORY_CARE_PROVIDER_SITE_OTHER): Payer: Self-pay | Admitting: Orthopaedic Surgery

## 2017-02-15 ENCOUNTER — Other Ambulatory Visit (INDEPENDENT_AMBULATORY_CARE_PROVIDER_SITE_OTHER): Payer: Self-pay | Admitting: Orthopaedic Surgery

## 2017-02-15 DIAGNOSIS — M1711 Unilateral primary osteoarthritis, right knee: Secondary | ICD-10-CM | POA: Diagnosis not present

## 2017-02-15 DIAGNOSIS — M1712 Unilateral primary osteoarthritis, left knee: Secondary | ICD-10-CM

## 2017-02-15 MED ORDER — BUPIVACAINE HCL 0.5 % IJ SOLN
2.0000 mL | INTRAMUSCULAR | Status: AC | PRN
  Administered 2017-02-15: 2 mL via INTRA_ARTICULAR

## 2017-02-15 MED ORDER — LIDOCAINE HCL 1 % IJ SOLN
2.0000 mL | INTRAMUSCULAR | Status: AC | PRN
  Administered 2017-02-15: 2 mL

## 2017-02-15 MED ORDER — METHYLPREDNISOLONE ACETATE 40 MG/ML IJ SUSP
40.0000 mg | INTRAMUSCULAR | Status: AC | PRN
  Administered 2017-02-15: 40 mg via INTRA_ARTICULAR

## 2017-02-15 NOTE — Progress Notes (Signed)
Office Visit Note   Patient: Tonya Webster           Date of Birth: 18-Nov-1960           MRN: [id number] Visit Date: 02/15/2017              Requested by: Lennie Odor, PA-C [address] [place], [place] [address] PCP: Lennie Odor, PA-C   Assessment & Plan: Visit Diagnoses:  1. Unilateral primary osteoarthritis, left knee   2. Unilateral primary osteoarthritis, right knee     Plan: Patient has advanced degenerative joint disease. Bilateral knee cortisone injections were performed today. We have submitted preauthorization for monovisc injections.    Follow-Up Instructions: Return if symptoms worsen or fail to improve.   Orders:  No orders of the defined types were placed in this encounter.  No orders of the defined types were placed in this encounter.     Procedures: Large Joint Inj Date/Time: 02/15/2017 9:17 AM Performed by: Leandrew Koyanagi Authorized by: Leandrew Koyanagi   Consent Given by:  Patient Timeout: prior to procedure the correct patient, procedure, and site was verified   Indications:  Pain Location:  Knee Site:  R knee Prep: patient was prepped and draped in usual sterile fashion   Needle Size:  22 G Ultrasound Guidance: No   Fluoroscopic Guidance: No   Arthrogram: No   Patient tolerance:  Patient tolerated the procedure well with no immediate complications Large Joint Inj Date/Time: 02/15/2017 9:17 AM Performed by: Leandrew Koyanagi Authorized by: Leandrew Koyanagi   Consent Given by:  Patient Timeout: prior to procedure the correct patient, procedure, and site was verified   Indications:  Pain Location:  Knee Site:  L knee Prep: patient was prepped and draped in usual sterile fashion   Needle Size:  22 G Ultrasound Guidance: No   Fluoroscopic Guidance: No   Arthrogram: No   Medications:  2 mL lidocaine 1 %; 2 mL bupivacaine 0.5 %; 40 mg methylPREDNISolone acetate 40 MG/ML Patient tolerance:  Patient tolerated the procedure well with no  immediate complications     Clinical Data: No additional findings.   Subjective: Chief Complaint  Patient presents with  . Right Knee - Pain  . Left Knee - Pain    Patient follows up today for bilateral knee osteoarthritis. Last saw her 2017. She is requesting bilateral cortisone injections and she is interested in viscosupplementation.  She denies any changes in medical history.    Review of Systems   Objective: Vital Signs: There were no vitals taken for this visit.  Physical Exam  Ortho Exam Bilateral knee exams are stable. No joint effusion. Specialty Comments:  No specialty comments available.  Imaging: No results found.   PMFS History: There are no active problems to display for this patient.  Past Medical History:  Diagnosis Date  . Asthma   . Environmental allergies   . Fibromyalgia   . MRSA infection   . Thyroid disease     No family history on file.  Past Surgical History:  Procedure Laterality Date  . BACK SURGERY    . CESAREAN SECTION    . GASTRIC BYPASS     Social History   Occupational History  . Not on file.   Social History Main Topics  . Smoking status: Never Smoker  . Smokeless tobacco: Never Used  . Alcohol use No  . Drug use: No  . Sexual activity: Not on file

## 2017-02-19 ENCOUNTER — Other Ambulatory Visit: Payer: Self-pay | Admitting: Physician Assistant

## 2017-02-19 DIAGNOSIS — Z1231 Encounter for screening mammogram for malignant neoplasm of breast: Secondary | ICD-10-CM

## 2017-02-25 ENCOUNTER — Telehealth (INDEPENDENT_AMBULATORY_CARE_PROVIDER_SITE_OTHER): Payer: Self-pay

## 2017-02-25 NOTE — Telephone Encounter (Signed)
Called patient. Monovisc needed additional information. Faxed over form.   Pending.Marland KitchenMarland KitchenMarland Kitchen

## 2017-02-25 NOTE — Telephone Encounter (Signed)
Patient would like to know the status for Monovisc Injection.  [phone].  Please advise.  Thank you.

## 2017-02-26 ENCOUNTER — Telehealth (INDEPENDENT_AMBULATORY_CARE_PROVIDER_SITE_OTHER): Payer: Self-pay | Admitting: Orthopaedic Surgery

## 2017-02-26 MED ORDER — TRAMADOL HCL 50 MG PO TABS: 100.0000 mg | ORAL_TABLET | Freq: Two times a day (BID) | ORAL | 0 refills | Status: DC | PRN

## 2017-02-26 NOTE — Telephone Encounter (Signed)
Patient called and states Meloxicam and tylenol are not helping at all and is having a lot of pain. Monovisc are still pending and would like to know if she can get something for pain that is strong in the mean time.

## 2017-02-26 NOTE — Telephone Encounter (Signed)
Tramadol 100 mg bid prn pain #30

## 2017-02-26 NOTE — Telephone Encounter (Signed)
Called into pharm  

## 2017-02-27 ENCOUNTER — Telehealth (INDEPENDENT_AMBULATORY_CARE_PROVIDER_SITE_OTHER): Payer: Self-pay

## 2017-02-27 NOTE — Telephone Encounter (Signed)
Called [facility] back states they do not make 100mg  only comes in 50 mg said per Dr Erlinda Hong  2 tabs=100mg 

## 2017-02-27 NOTE — Telephone Encounter (Signed)
Tonya Webster with  [facility] pharmacy would like clarification on directions for Tramadol.  Cb# is [phone].  Please advise. Thank You.

## 2017-02-27 NOTE — Telephone Encounter (Signed)
Called patient no answer, LMOM to return my call regarding Monovisc inj's.

## 2017-02-28 ENCOUNTER — Telehealth (INDEPENDENT_AMBULATORY_CARE_PROVIDER_SITE_OTHER): Payer: Self-pay | Admitting: Orthopaedic Surgery

## 2017-02-28 NOTE — Telephone Encounter (Signed)
Patient was returning your phone call, if you could give her a call back at [phone]

## 2017-03-01 NOTE — Telephone Encounter (Signed)
Okay to buy & bill per wendy  Called pt to let her know VOB and she scheduled appt

## 2017-03-05 ENCOUNTER — Ambulatory Visit
Admission: RE | Admit: 2017-03-05 | Discharge: 2017-03-05 | Disposition: A | Discharge status: Home / Self Care | Payer: 59 | Source: Ambulatory Visit | Attending: Physician Assistant | Admitting: Physician Assistant

## 2017-03-05 DIAGNOSIS — Z1231 Encounter for screening mammogram for malignant neoplasm of breast: Secondary | ICD-10-CM

## 2017-03-08 ENCOUNTER — Ambulatory Visit (INDEPENDENT_AMBULATORY_CARE_PROVIDER_SITE_OTHER): Payer: 59 | Admitting: Orthopaedic Surgery

## 2017-03-08 DIAGNOSIS — M1712 Unilateral primary osteoarthritis, left knee: Secondary | ICD-10-CM

## 2017-03-08 DIAGNOSIS — M1711 Unilateral primary osteoarthritis, right knee: Secondary | ICD-10-CM | POA: Diagnosis not present

## 2017-03-08 MED ORDER — HYALURONAN 88 MG/4ML IX SOSY
88.0000 mg | PREFILLED_SYRINGE | INTRA_ARTICULAR | Status: AC | PRN
  Administered 2017-03-08: 88 mg via INTRA_ARTICULAR

## 2017-03-08 NOTE — Progress Notes (Signed)
   Procedure Note  Patient: Tonya Webster             Date of Birth: 1960-12-25           MRN: [id number]             Visit Date: 03/08/2017  Procedures: Visit Diagnoses: Unilateral primary osteoarthritis, left knee  Unilateral primary osteoarthritis, right knee  Large Joint Inj Date/Time: 03/08/2017 8:22 AM Performed by: Leandrew Koyanagi Authorized by: Leandrew Koyanagi   Consent Given by:  Patient Timeout: prior to procedure the correct patient, procedure, and site was verified   Indications:  Pain Location:  Knee Site:  R knee Prep: patient was prepped and draped in usual sterile fashion   Needle Size:  22 G Approach:  Anterolateral Ultrasound Guidance: No   Fluoroscopic Guidance: No   Arthrogram: No   Medications:  88 mg Hyaluronan 88 MG/4ML Large Joint Inj Date/Time: 03/08/2017 8:22 AM Performed by: Leandrew Koyanagi Authorized by: Leandrew Koyanagi   Consent Given by:  Patient Timeout: prior to procedure the correct patient, procedure, and site was verified   Indications:  Pain Location:  Knee Site:  L knee Prep: patient was prepped and draped in usual sterile fashion   Needle Size:  22 G Approach:  Anterolateral Ultrasound Guidance: No   Fluoroscopic Guidance: No   Arthrogram: No   Medications:  88 mg Hyaluronan 88 MG/4ML

## 2017-04-25 ENCOUNTER — Telehealth (INDEPENDENT_AMBULATORY_CARE_PROVIDER_SITE_OTHER): Payer: Self-pay | Admitting: Orthopaedic Surgery

## 2017-04-25 NOTE — Telephone Encounter (Signed)
Rx refill Tramadol °

## 2017-04-25 NOTE — Telephone Encounter (Signed)
50-100 mg 3 times a day as needed #30

## 2017-04-25 NOTE — Telephone Encounter (Signed)
Please advise. How many & instructions?

## 2017-04-26 MED ORDER — TRAMADOL HCL 50 MG PO TABS: ORAL_TABLET | ORAL | 0 refills | Status: DC

## 2017-04-26 NOTE — Telephone Encounter (Signed)
Called to [facility] [address] per patient request. Patient advised.

## 2017-10-03 ENCOUNTER — Other Ambulatory Visit: Payer: Self-pay | Admitting: Physician Assistant

## 2017-10-03 ENCOUNTER — Other Ambulatory Visit (HOSPITAL_COMMUNITY)
Admission: RE | Admit: 2017-10-03 | Discharge: 2017-10-03 | Disposition: A | Discharge status: Home / Self Care | Payer: No Typology Code available for payment source | Source: Ambulatory Visit | Attending: Family Medicine | Admitting: Family Medicine

## 2017-10-03 DIAGNOSIS — Z01419 Encounter for gynecological examination (general) (routine) without abnormal findings: Secondary | ICD-10-CM | POA: Insufficient documentation

## 2017-10-04 LAB — CYTOLOGY - PAP: Diagnosis: NEGATIVE

## 2018-03-26 ENCOUNTER — Other Ambulatory Visit: Payer: Self-pay | Admitting: Physician Assistant

## 2018-03-26 DIAGNOSIS — Z1231 Encounter for screening mammogram for malignant neoplasm of breast: Secondary | ICD-10-CM

## 2018-04-04 DIAGNOSIS — J309 Allergic rhinitis, unspecified: Secondary | ICD-10-CM | POA: Diagnosis not present

## 2018-04-04 DIAGNOSIS — F322 Major depressive disorder, single episode, severe without psychotic features: Secondary | ICD-10-CM | POA: Diagnosis not present

## 2018-04-04 DIAGNOSIS — N3281 Overactive bladder: Secondary | ICD-10-CM | POA: Diagnosis not present

## 2018-04-04 DIAGNOSIS — E039 Hypothyroidism, unspecified: Secondary | ICD-10-CM | POA: Diagnosis not present

## 2018-04-04 DIAGNOSIS — E559 Vitamin D deficiency, unspecified: Secondary | ICD-10-CM | POA: Diagnosis not present

## 2018-04-04 DIAGNOSIS — M797 Fibromyalgia: Secondary | ICD-10-CM | POA: Diagnosis not present

## 2018-04-04 DIAGNOSIS — D649 Anemia, unspecified: Secondary | ICD-10-CM | POA: Diagnosis not present

## 2018-04-04 DIAGNOSIS — I10 Essential (primary) hypertension: Secondary | ICD-10-CM | POA: Diagnosis not present

## 2018-04-04 DIAGNOSIS — H3530 Unspecified macular degeneration: Secondary | ICD-10-CM | POA: Diagnosis not present

## 2018-04-04 DIAGNOSIS — J45909 Unspecified asthma, uncomplicated: Secondary | ICD-10-CM | POA: Diagnosis not present

## 2018-04-04 DIAGNOSIS — Z23 Encounter for immunization: Secondary | ICD-10-CM | POA: Diagnosis not present

## 2018-04-14 DIAGNOSIS — R42 Dizziness and giddiness: Secondary | ICD-10-CM | POA: Diagnosis not present

## 2018-04-14 DIAGNOSIS — H9311 Tinnitus, right ear: Secondary | ICD-10-CM | POA: Diagnosis not present

## 2018-04-14 DIAGNOSIS — H6123 Impacted cerumen, bilateral: Secondary | ICD-10-CM | POA: Diagnosis not present

## 2018-05-05 ENCOUNTER — Ambulatory Visit
Admission: RE | Admit: 2018-05-05 | Discharge: 2018-05-05 | Disposition: A | Discharge status: Home / Self Care | Payer: No Typology Code available for payment source | Source: Ambulatory Visit | Attending: Physician Assistant | Admitting: Physician Assistant

## 2018-05-05 DIAGNOSIS — Z1231 Encounter for screening mammogram for malignant neoplasm of breast: Secondary | ICD-10-CM

## 2018-05-10 ENCOUNTER — Emergency Department (HOSPITAL_COMMUNITY)
Admission: EM | Admit: 2018-05-10 | Discharge: 2018-05-10 | Disposition: A | Discharge status: Home / Self Care | Payer: Medicare Other | Attending: Emergency Medicine | Admitting: Emergency Medicine

## 2018-05-10 ENCOUNTER — Encounter (HOSPITAL_COMMUNITY): Payer: Self-pay

## 2018-05-10 ENCOUNTER — Emergency Department (HOSPITAL_COMMUNITY): Payer: Medicare Other

## 2018-05-10 DIAGNOSIS — M1711 Unilateral primary osteoarthritis, right knee: Secondary | ICD-10-CM | POA: Diagnosis not present

## 2018-05-10 DIAGNOSIS — J45909 Unspecified asthma, uncomplicated: Secondary | ICD-10-CM | POA: Insufficient documentation

## 2018-05-10 DIAGNOSIS — M179 Osteoarthritis of knee, unspecified: Secondary | ICD-10-CM | POA: Diagnosis not present

## 2018-05-10 DIAGNOSIS — M25561 Pain in right knee: Secondary | ICD-10-CM

## 2018-05-10 DIAGNOSIS — G8929 Other chronic pain: Secondary | ICD-10-CM

## 2018-05-10 DIAGNOSIS — Z79899 Other long term (current) drug therapy: Secondary | ICD-10-CM | POA: Insufficient documentation

## 2018-05-10 MED ORDER — OXYCODONE-ACETAMINOPHEN 5-325 MG PO TABS
1.0000 | ORAL_TABLET | Freq: Once | ORAL | Status: AC
  Administered 2018-05-10: 1 via ORAL
  Filled 2018-05-10: qty 1

## 2018-05-10 MED ORDER — KETOROLAC TROMETHAMINE 60 MG/2ML IM SOLN
60.0000 mg | Freq: Once | INTRAMUSCULAR | Status: AC
  Administered 2018-05-10: 60 mg via INTRAMUSCULAR
  Filled 2018-05-10: qty 2

## 2018-05-10 NOTE — Discharge Instructions (Addendum)
You were seen in the ER for knee pain.  X-ray today show severe arthritis.  This may be worsening due to the cold/weather changes.  Further management needs to be done by orthopedist.  Continue taking tramadol.  You can take up to 1000 mg of Tylenol every 6-8 hours.  Wear your brace to help with swelling and laxity.  Return to the ER for increased swelling, redness, warmth of your joint, fevers, chills, calf pain.

## 2018-05-10 NOTE — ED Triage Notes (Signed)
Pt c/o R knee pain; states that she has been told that she needs sx but has been "putting it off." Pt states that she has taken 2 extra strength tylenol in am and pm and 50mg  of tramadol and has attempted to apply ice without relieve.

## 2018-05-10 NOTE — ED Notes (Signed)
Pt discharged from ED; instructions provided and scripts given; Pt encouraged to return to ED if symptoms worsen and to f/u with PCP; Pt verbalized understanding of all instructions 

## 2018-05-10 NOTE — ED Provider Notes (Signed)
[facility] EMERGENCY DEPARTMENT Provider Note   CSN: [id number] Arrival date & time: 05/10/18  0530     History   Chief Complaint Chief Complaint  Patient presents with  . Knee Pain    R    HPI Tonya Webster is a 57 y.o. female with known advanced arthritis of bilateral knees is here for evaluation of acute on chronic right knee pain.  Onset 2 days ago.  The pain is constant, severe but worse when she puts weight on it..  Improved with rest.  She takes tramadol and Tylenol and this has not been helping lately.  She has iced and elevated it without benefit.  No alleviating factors.  She was seen by Dr. Erlinda Hong last year who told him she needed surgery at some point.  She denies any direct trauma.  She denies any joint swelling, redness, warmth, calf pain, distal paresthesias or loss of sensation. No h/o gout.  HPI  Past Medical History:  Diagnosis Date  . Asthma   . Environmental allergies   . Fibromyalgia   . MRSA infection   . Thyroid disease     Patient Active Problem List   Diagnosis Date Noted  . Unilateral primary osteoarthritis, left knee 03/08/2017  . Unilateral primary osteoarthritis, right knee 03/08/2017    Past Surgical History:  Procedure Laterality Date  . BACK SURGERY    . CESAREAN SECTION    . GASTRIC BYPASS       OB History   None      Home Medications    Prior to Admission medications   Medication Sig Start Date End Date Taking? Authorizing Provider  acetaminophen (TYLENOL) 650 MG CR tablet Take 1,300 mg by mouth 2 (two) times daily.    [provider]  citalopram (CELEXA) 40 MG tablet Take 40 mg by mouth daily.    [provider]  diphenhydrAMINE (BENADRYL) 25 mg capsule Take 50 mg by mouth once.    [provider]  gabapentin (NEURONTIN) 600 MG tablet Take 600 mg by mouth 2 (two) times daily.    [provider]  levothyroxine (SYNTHROID, LEVOTHROID) 137 MCG tablet Take 137 mcg by mouth  daily before breakfast.    [provider]  LORazepam (ATIVAN) 1 MG tablet Take 1 tablet (1 mg total) by mouth every 6 (six) hours as needed for anxiety (or itching not relieved by benadryl). 2/29/79   Delora Fuel, MD  meloxicam (MOBIC) 7.5 MG tablet TAKE ONE TABLET BY MOUTH TWICE DAILY FOR  2  WEEKS 02/15/17   Leandrew Koyanagi, MD  oxybutynin (DITROPAN) 5 MG tablet Take 5 mg by mouth 2 (two) times daily.    [provider]  predniSONE (DELTASONE) 50 MG tablet Take 1 tablet (50 mg total) by mouth daily. 8/92/11   Delora Fuel, MD  traMADol Veatrice Bourbon) 50 MG tablet Take 1-2 tablets three times daily as needed for pain. 04/26/17   Leandrew Koyanagi, MD    Family History Family History  Problem Relation Age of Onset  . Breast cancer Neg Hx     Social History Social History   Tobacco Use  . Smoking status: Never Smoker  . Smokeless tobacco: Never Used  Substance Use Topics  . Alcohol use: No  . Drug use: No     Allergies   Soy allergy   Review of Systems Review of Systems  Musculoskeletal: Positive for arthralgias.  All other systems reviewed and are negative.  Physical Exam Updated Vital Signs BP (!) 156/79   Temp 97.6 F (36.4 C) (Oral)   LMP 07/27/2013   Physical Exam  Constitutional: She is oriented to person, place, and time. She appears well-developed and well-nourished.  Non-toxic appearance.  HENT:  Head: Normocephalic.  Right Ear: External ear normal.  Left Ear: External ear normal.  Nose: Nose normal.  Eyes: Conjunctivae and EOM are normal.  Neck: Full passive range of motion without pain.  Cardiovascular: Normal rate.  1+ DP and PT pulses bilaterally. Toes warm.   Pulmonary/Chest: Effort normal. No tachypnea. No respiratory distress.  Musculoskeletal: Normal range of motion. She exhibits tenderness.  Right knee: slightly more edematous medially compared to right. No overlaying erythema, warmth, fluctuance. Mild tenderness medially.  Full ROM  with minimal pain. No laxity. Pain with weight bearing.   Neurological: She is alert and oriented to person, place, and time.  Sensation to lower legs intact. 5/5 strength with toe/ankle F/E.   Skin: Skin is warm and dry. Capillary refill takes less than 2 seconds.  Psychiatric: Her behavior is normal. Thought content normal.     ED Treatments / Results  Labs (all labs ordered are listed, but only abnormal results are displayed) Labs Reviewed - No data to display  EKG None  Radiology Dg Knee Complete 4 Views Right  Result Date: 05/10/2018 CLINICAL DATA:  Initial evaluation for acute on chronic right knee pain. EXAM: RIGHT KNEE - COMPLETE 4+ VIEW COMPARISON:  None. FINDINGS: No acute fracture or dislocation. No joint effusion. Severe tricompartmental degenerative osteoarthrosis, most notable at the patellofemoral articulation. No focal osseous lesions. No acute soft tissue abnormality. IMPRESSION: 1. No acute osseous abnormality about the right knee. 2. Severe tricompartmental degenerative osteoarthrosis. Electronically Signed   By: Jeannine Boga M.D.   On: 05/10/2018 07:05    Procedures Procedures (including critical care time)  Medications Ordered in ED Medications  ketorolac (TORADOL) injection 60 mg (60 mg Intramuscular Given 05/10/18 0627)  oxyCODONE-acetaminophen (PERCOCET/ROXICET) 5-325 MG per tablet 1 tablet (1 tablet Oral Given 05/10/18 7341)     Initial Impression / Assessment and Plan / ED Course  I have reviewed the triage vital signs and the nursing notes.  Pertinent labs & imaging results that were available during my care of the patient were reviewed by me and considered in my medical decision making (see chart for details).  Clinical Course as of May 10 714  Sat May 10, 2018  9379 IMPRESSION: 1. No acute osseous abnormality about the right knee. 2. Severe tricompartmental degenerative osteoarthrosis.  DG Knee Complete 4 Views Right [CG]    Clinical  Course User Index [CG] Kinnie Feil, PA-C    Given underlying history and presentation/exam most likely this is related to her known arthritis.  I considered gout but she has no history of this and there is no clinical findings to suggest this.  No overlying cellulitis.  I doubt septic arthritis in this patient.  She has no calf tenderness.  X-ray confirms severe tricompartmental OA but no acute abnormalities.  Her pain has been managed in the ER.  I will discharge her with close Ortho follow-up.  She has maxed out outpatient tx for this. Pain has been refractory to tramadol, tylenol, RICe, brace, injections.  Dr Erlinda Hong has recommended surgery.  She has prescription for tramadol at home.  Return precautions given.  Pt understands plan and is comfortable with this.   Final Clinical Impressions(s) / ED Diagnoses   Final  diagnoses:  Tricompartment osteoarthritis of right knee  Chronic pain of right knee    ED Discharge Orders    None       Arlean Hopping 05/10/18 Gloriajean Dell    Duffy Bruce, MD 05/10/18 2325

## 2018-05-20 DIAGNOSIS — G8929 Other chronic pain: Secondary | ICD-10-CM | POA: Insufficient documentation

## 2018-05-20 DIAGNOSIS — M170 Bilateral primary osteoarthritis of knee: Secondary | ICD-10-CM | POA: Diagnosis not present

## 2018-05-20 DIAGNOSIS — M25561 Pain in right knee: Secondary | ICD-10-CM | POA: Insufficient documentation

## 2018-05-23 DIAGNOSIS — M179 Osteoarthritis of knee, unspecified: Secondary | ICD-10-CM | POA: Diagnosis not present

## 2018-05-23 DIAGNOSIS — Z01810 Encounter for preprocedural cardiovascular examination: Secondary | ICD-10-CM | POA: Diagnosis not present

## 2018-05-27 ENCOUNTER — Other Ambulatory Visit: Payer: Self-pay | Admitting: Physician Assistant

## 2018-05-27 ENCOUNTER — Ambulatory Visit
Admission: RE | Admit: 2018-05-27 | Discharge: 2018-05-27 | Disposition: A | Discharge status: Home / Self Care | Payer: Medicare Other | Source: Ambulatory Visit | Attending: Physician Assistant | Admitting: Physician Assistant

## 2018-05-27 DIAGNOSIS — Z01818 Encounter for other preprocedural examination: Secondary | ICD-10-CM

## 2018-05-30 NOTE — Progress Notes (Signed)
05/27/2018- noted in [place]- CXR 2 view

## 2018-05-30 NOTE — Patient Instructions (Signed)
Tonya Webster  05/30/2018   Your procedure is scheduled on: Monday 06/16/2018  Report to [facility] Main  Entrance              Report to admitting at  [place]  AM    Call this number if you have problems the morning of surgery [phone]    Remember: Do not eat food or drink liquids :After Midnight.               BRUSH YOUR TEETH MORNING OF SURGERY AND RINSE YOUR MOUTH OUT, NO CHEWING GUM CANDY OR MINTS.     Take these medicines the morning of surgery with A SIP OF WATER: Levothyroxine (Synthroid), Buspirone (Buspar), Gabapentin (Neurontin), use Symbicort inhaler if needed and bring inhaler with you to the hospital                               You may not have any metal on your body including hair pins and              piercings  Do not wear jewelry, make-up, lotions, powders or perfumes, deodorant             Do not wear nail polish.  Do not shave  48 hours prior to surgery.               Do not bring valuables to the hospital. [place].  Contacts, dentures or bridgework may not be worn into surgery.  Leave suitcase in the car. After surgery it may be brought to your room.                   Please read over the following fact sheets you were given: _____________________________________________________________________             [facility] - Preparing for Surgery Before surgery, you can play an important role.  Because skin is not sterile, your skin needs to be as free of germs as possible.  You can reduce the number of germs on your skin by washing with CHG (chlorahexidine gluconate) soap before surgery.  CHG is an antiseptic cleaner which kills germs and bonds with the skin to continue killing germs even after washing. Please DO NOT use if you have an allergy to CHG or antibacterial soaps.  If your skin becomes reddened/irritated stop using the CHG and inform your nurse when you arrive at Short  Stay. Do not shave (including legs and underarms) for at least 48 hours prior to the first CHG shower.  You may shave your face/neck. Please follow these instructions carefully:  1.  Shower with CHG Soap the night before surgery and the  morning of Surgery.  2.  If you choose to wash your hair, wash your hair first as usual with your  normal  shampoo.  3.  After you shampoo, rinse your hair and body thoroughly to remove the  shampoo.                           4.  Use CHG as you would any other liquid soap.  You can apply chg directly  to the skin and wash  Gently with a scrungie or clean washcloth.  5.  Apply the CHG Soap to your body ONLY FROM THE NECK DOWN.   Do not use on face/ open                           Wound or open sores. Avoid contact with eyes, ears mouth and genitals (private parts).                       Wash face,  Genitals (private parts) with your normal soap.             6.  Wash thoroughly, paying special attention to the area where your surgery  will be performed.  7.  Thoroughly rinse your body with warm water from the neck down.  8.  DO NOT shower/wash with your normal soap after using and rinsing off  the CHG Soap.                9.  Pat yourself dry with a clean towel.            10.  Wear clean pajamas.            11.  Place clean sheets on your bed the night of your first shower and do not  sleep with pets. Day of Surgery : Do not apply any lotions/deodorants the morning of surgery.  Please wear clean clothes to the hospital/surgery center.  FAILURE TO FOLLOW THESE INSTRUCTIONS MAY RESULT IN THE CANCELLATION OF YOUR SURGERY PATIENT SIGNATURE_________________________________  NURSE SIGNATURE__________________________________  ________________________________________________________________________   [name]  An incentive spirometer is a tool that can help keep your lungs clear and active. This tool measures how well you are  filling your lungs with each breath. Taking long deep breaths may help reverse or decrease the chance of developing breathing (pulmonary) problems (especially infection) following:  A long period of time when you are unable to move or be active. BEFORE THE PROCEDURE   If the spirometer includes an indicator to show your best effort, your nurse or respiratory therapist will set it to a desired goal.  If possible, sit up straight or lean slightly forward. Try not to slouch.  Hold the incentive spirometer in an upright position. INSTRUCTIONS FOR USE  1. Sit on the edge of your bed if possible, or sit up as far as you can in bed or on a chair. 2. Hold the incentive spirometer in an upright position. 3. Breathe out normally. 4. Place the mouthpiece in your mouth and seal your lips tightly around it. 5. Breathe in slowly and as deeply as possible, raising the piston or the ball toward the top of the column. 6. Hold your breath for 3-5 seconds or for as long as possible. Allow the piston or ball to fall to the bottom of the column. 7. Remove the mouthpiece from your mouth and breathe out normally. 8. Rest for a few seconds and repeat Steps 1 through 7 at least 10 times every 1-2 hours when you are awake. Take your time and take a few normal breaths between deep breaths. 9. The spirometer may include an indicator to show your best effort. Use the indicator as a goal to work toward during each repetition. 10. After each set of 10 deep breaths, practice coughing to be sure your lungs are clear. If you have an incision (the cut made at the time of surgery),  support your incision when coughing by placing a pillow or rolled up towels firmly against it. Once you are able to get out of bed, walk around indoors and cough well. You may stop using the incentive spirometer when instructed by your caregiver.  RISKS AND COMPLICATIONS  Take your time so you do not get dizzy or light-headed.  If you are in pain,  you may need to take or ask for pain medication before doing incentive spirometry. It is harder to take a deep breath if you are having pain. AFTER USE  Rest and breathe slowly and easily.  It can be helpful to keep track of a log of your progress. Your caregiver can provide you with a simple table to help with this. If you are using the spirometer at home, follow these instructions: [place] IF:   You are having difficultly using the spirometer.  You have trouble using the spirometer as often as instructed.  Your pain medication is not giving enough relief while using the spirometer.  You develop fever of 100.5 F (38.1 C) or higher. SEEK IMMEDIATE MEDICAL CARE IF:   You cough up bloody sputum that had not been present before.  You develop fever of 102 F (38.9 C) or greater.  You develop worsening pain at or near the incision site. MAKE SURE YOU:   Understand these instructions.  Will watch your condition.  Will get help right away if you are not doing well or get worse. Document Released: 10/08/2006 Document Revised: 08/20/2011 Document Reviewed: 12/09/2006 [facility] Patient Information 2014 [place], [place].   ________________________________________________________________________

## 2018-06-02 ENCOUNTER — Other Ambulatory Visit: Payer: Self-pay

## 2018-06-02 ENCOUNTER — Encounter (HOSPITAL_COMMUNITY): Payer: Self-pay

## 2018-06-02 ENCOUNTER — Other Ambulatory Visit: Payer: Self-pay | Admitting: Orthopedic Surgery

## 2018-06-02 ENCOUNTER — Encounter (HOSPITAL_COMMUNITY)
Admission: RE | Admit: 2018-06-02 | Discharge: 2018-06-02 | Disposition: A | Discharge status: Home / Self Care | Payer: Medicare Other | Source: Ambulatory Visit | Attending: Orthopedic Surgery | Admitting: Orthopedic Surgery

## 2018-06-02 DIAGNOSIS — M1712 Unilateral primary osteoarthritis, left knee: Secondary | ICD-10-CM | POA: Diagnosis not present

## 2018-06-02 DIAGNOSIS — Z01818 Encounter for other preprocedural examination: Secondary | ICD-10-CM | POA: Diagnosis not present

## 2018-06-02 HISTORY — DX: Restless legs syndrome: G25.81

## 2018-06-02 HISTORY — DX: Anemia, unspecified: D64.9

## 2018-06-02 HISTORY — DX: Pneumonia, unspecified organism: J18.9

## 2018-06-02 HISTORY — DX: Cellulitis, unspecified: L03.90

## 2018-06-02 HISTORY — DX: Sleep apnea, unspecified: G47.30

## 2018-06-02 HISTORY — DX: Hypothyroidism, unspecified: E03.9

## 2018-06-02 HISTORY — DX: Essential (primary) hypertension: I10

## 2018-06-02 LAB — SURGICAL PCR SCREEN
MRSA, PCR: NEGATIVE
Staphylococcus aureus: NEGATIVE

## 2018-06-02 LAB — CBC WITH DIFFERENTIAL/PLATELET
Abs Immature Granulocytes: 0.01 10*3/uL (ref 0.00–0.07)
Basophils Absolute: 0 10*3/uL (ref 0.0–0.1)
Basophils Relative: 0 %
Eosinophils Absolute: 0.1 10*3/uL (ref 0.0–0.5)
Eosinophils Relative: 2 %
HEMATOCRIT: 37.7 % (ref 36.0–46.0)
Hemoglobin: 12.1 g/dL (ref 12.0–15.0)
Immature Granulocytes: 0 %
LYMPHS ABS: 2.5 10*3/uL (ref 0.7–4.0)
Lymphocytes Relative: 52 %
MCH: 31.1 pg (ref 26.0–34.0)
MCHC: 32.1 g/dL (ref 30.0–36.0)
MCV: 96.9 fL (ref 80.0–100.0)
Monocytes Absolute: 0.4 10*3/uL (ref 0.1–1.0)
Monocytes Relative: 9 %
Neutro Abs: 1.7 10*3/uL (ref 1.7–7.7)
Neutrophils Relative %: 37 %
Platelets: 234 10*3/uL (ref 150–400)
RBC: 3.89 MIL/uL (ref 3.87–5.11)
RDW: 12.7 % (ref 11.5–15.5)
WBC: 4.7 10*3/uL (ref 4.0–10.5)
nRBC: 0 % (ref 0.0–0.2)

## 2018-06-02 LAB — COMPREHENSIVE METABOLIC PANEL
ALT: 18 U/L (ref 0–44)
AST: 21 U/L (ref 15–41)
Albumin: 3.9 g/dL (ref 3.5–5.0)
Alkaline Phosphatase: 89 U/L (ref 38–126)
Anion gap: 9 (ref 5–15)
BUN: 12 mg/dL (ref 6–20)
CALCIUM: 8.9 mg/dL (ref 8.9–10.3)
CO2: 25 mmol/L (ref 22–32)
Chloride: 107 mmol/L (ref 98–111)
Creatinine, Ser: 0.71 mg/dL (ref 0.44–1.00)
GFR calc Af Amer: >60 mL/min (ref >60)
GFR calc non Af Amer: >60 mL/min (ref >60)
Glucose, Bld: 88 mg/dL (ref 70–99)
Potassium: 4.4 mmol/L (ref 3.5–5.1)
Sodium: 141 mmol/L (ref 135–145)
Total Bilirubin: 0.4 mg/dL (ref 0.3–1.2)
Total Protein: 6.6 g/dL (ref 6.5–8.1)

## 2018-06-02 NOTE — Progress Notes (Addendum)
05-23-18 lov Noelle Redmon PA in chart ekg 05-23-18 chart

## 2018-06-15 MED ORDER — BUPIVACAINE LIPOSOME 1.3 % IJ SUSP
20.0000 mL | Freq: Once | INTRAMUSCULAR | Status: DC
  Filled 2018-06-15: qty 20

## 2018-06-16 ENCOUNTER — Encounter (HOSPITAL_COMMUNITY)
Admission: RE | Disposition: A | Discharge status: Home / Self Care | Payer: Self-pay | Source: Other Acute Inpatient Hospital | Attending: Orthopedic Surgery

## 2018-06-16 ENCOUNTER — Ambulatory Visit (HOSPITAL_COMMUNITY): Payer: Medicare Other | Admitting: Certified Registered Nurse Anesthetist

## 2018-06-16 ENCOUNTER — Other Ambulatory Visit: Payer: Self-pay | Admitting: Orthopedic Surgery

## 2018-06-16 ENCOUNTER — Observation Stay (HOSPITAL_COMMUNITY)
Admission: RE | Admit: 2018-06-16 | Discharge: 2018-06-17 | Disposition: A | Discharge status: Home / Self Care | Payer: Medicare Other | Source: Other Acute Inpatient Hospital | Attending: Orthopedic Surgery | Admitting: Orthopedic Surgery

## 2018-06-16 ENCOUNTER — Encounter (HOSPITAL_COMMUNITY): Payer: Self-pay | Admitting: Emergency Medicine

## 2018-06-16 ENCOUNTER — Other Ambulatory Visit: Payer: Self-pay

## 2018-06-16 DIAGNOSIS — M25562 Pain in left knee: Secondary | ICD-10-CM | POA: Diagnosis present

## 2018-06-16 DIAGNOSIS — G8918 Other acute postprocedural pain: Secondary | ICD-10-CM | POA: Diagnosis not present

## 2018-06-16 DIAGNOSIS — Z9884 Bariatric surgery status: Secondary | ICD-10-CM | POA: Insufficient documentation

## 2018-06-16 DIAGNOSIS — Z8614 Personal history of Methicillin resistant Staphylococcus aureus infection: Secondary | ICD-10-CM | POA: Insufficient documentation

## 2018-06-16 DIAGNOSIS — G4730 Sleep apnea, unspecified: Secondary | ICD-10-CM | POA: Insufficient documentation

## 2018-06-16 DIAGNOSIS — Z96659 Presence of unspecified artificial knee joint: Secondary | ICD-10-CM

## 2018-06-16 DIAGNOSIS — M25762 Osteophyte, left knee: Secondary | ICD-10-CM | POA: Insufficient documentation

## 2018-06-16 DIAGNOSIS — I110 Hypertensive heart disease with heart failure: Secondary | ICD-10-CM | POA: Diagnosis not present

## 2018-06-16 DIAGNOSIS — M1712 Unilateral primary osteoarthritis, left knee: Secondary | ICD-10-CM | POA: Diagnosis not present

## 2018-06-16 DIAGNOSIS — I10 Essential (primary) hypertension: Secondary | ICD-10-CM | POA: Diagnosis not present

## 2018-06-16 HISTORY — PX: TOTAL KNEE ARTHROPLASTY: SHX125

## 2018-06-16 SURGERY — ARTHROPLASTY, KNEE, TOTAL
Anesthesia: General | Site: Knee | Laterality: Left

## 2018-06-16 MED ORDER — ASPIRIN EC 325 MG PO TBEC
325.0000 mg | DELAYED_RELEASE_TABLET | Freq: Two times a day (BID) | ORAL | Status: DC
  Administered 2018-06-17: 325 mg via ORAL
  Filled 2018-06-16: qty 1

## 2018-06-16 MED ORDER — HYDROMORPHONE HCL 1 MG/ML IJ SOLN
0.2500 mg | INTRAMUSCULAR | Status: DC | PRN
  Administered 2018-06-16 (×3): 0.5 mg via INTRAVENOUS

## 2018-06-16 MED ORDER — CEFAZOLIN SODIUM-DEXTROSE 2-4 GM/100ML-% IV SOLN
2.0000 g | Freq: Four times a day (QID) | INTRAVENOUS | Status: AC
  Administered 2018-06-16 – 2018-06-17 (×2): 2 g via INTRAVENOUS
  Filled 2018-06-16 (×2): qty 100

## 2018-06-16 MED ORDER — FENTANYL CITRATE (PF) 100 MCG/2ML IJ SOLN
INTRAMUSCULAR | Status: AC
  Filled 2018-06-16: qty 2

## 2018-06-16 MED ORDER — ZOLPIDEM TARTRATE 5 MG PO TABS: 5.0000 mg | ORAL_TABLET | Freq: Every evening | ORAL | Status: DC | PRN

## 2018-06-16 MED ORDER — PROPOFOL 10 MG/ML IV BOLUS
INTRAVENOUS | Status: DC | PRN
  Administered 2018-06-16: 180 mg via INTRAVENOUS

## 2018-06-16 MED ORDER — SODIUM CHLORIDE 0.9 % IR SOLN
Status: DC | PRN
  Administered 2018-06-16: 1000 mL

## 2018-06-16 MED ORDER — METOCLOPRAMIDE HCL 5 MG PO TABS: 5.0000 mg | ORAL_TABLET | Freq: Three times a day (TID) | ORAL | Status: DC | PRN

## 2018-06-16 MED ORDER — PROPOFOL 10 MG/ML IV BOLUS
INTRAVENOUS | Status: AC
  Filled 2018-06-16: qty 60

## 2018-06-16 MED ORDER — DIPHENHYDRAMINE HCL 12.5 MG/5ML PO ELIX
12.5000 mg | ORAL_SOLUTION | ORAL | Status: DC | PRN
  Administered 2018-06-16: 25 mg via ORAL
  Filled 2018-06-16 (×3): qty 10

## 2018-06-16 MED ORDER — PANTOPRAZOLE SODIUM 40 MG PO TBEC
40.0000 mg | DELAYED_RELEASE_TABLET | Freq: Every day | ORAL | Status: DC
  Administered 2018-06-16 – 2018-06-17 (×2): 40 mg via ORAL
  Filled 2018-06-16 (×2): qty 1

## 2018-06-16 MED ORDER — PHENYLEPHRINE HCL 10 MG/ML IJ SOLN
INTRAMUSCULAR | Status: AC
  Filled 2018-06-16: qty 1

## 2018-06-16 MED ORDER — TRAMADOL HCL 50 MG PO TABS
50.0000 mg | ORAL_TABLET | Freq: Four times a day (QID) | ORAL | Status: DC
  Administered 2018-06-17 (×2): 50 mg via ORAL
  Filled 2018-06-16 (×2): qty 1

## 2018-06-16 MED ORDER — SODIUM CHLORIDE 0.9% FLUSH
INTRAVENOUS | Status: DC | PRN
  Administered 2018-06-16: 20 mL

## 2018-06-16 MED ORDER — ONDANSETRON HCL 4 MG/2ML IJ SOLN: 4.0000 mg | Freq: Four times a day (QID) | INTRAMUSCULAR | Status: DC | PRN

## 2018-06-16 MED ORDER — BUPIVACAINE-EPINEPHRINE (PF) 0.25% -1:200000 IJ SOLN
INTRAMUSCULAR | Status: AC
  Filled 2018-06-16: qty 30

## 2018-06-16 MED ORDER — 0.9 % SODIUM CHLORIDE (POUR BTL) OPTIME
TOPICAL | Status: DC | PRN
  Administered 2018-06-16: 1000 mL

## 2018-06-16 MED ORDER — LEVOTHYROXINE SODIUM 125 MCG PO TABS
125.0000 ug | ORAL_TABLET | Freq: Every day | ORAL | Status: DC
  Administered 2018-06-17: 125 ug via ORAL
  Filled 2018-06-16: qty 1

## 2018-06-16 MED ORDER — MOMETASONE FURO-FORMOTEROL FUM 200-5 MCG/ACT IN AERO
2.0000 | INHALATION_SPRAY | Freq: Two times a day (BID) | RESPIRATORY_TRACT | Status: DC
  Administered 2018-06-17: 2 via RESPIRATORY_TRACT
  Filled 2018-06-16: qty 8.8

## 2018-06-16 MED ORDER — SUCCINYLCHOLINE CHLORIDE 200 MG/10ML IV SOSY
PREFILLED_SYRINGE | INTRAVENOUS | Status: AC
  Filled 2018-06-16: qty 10

## 2018-06-16 MED ORDER — PHENYLEPHRINE 40 MCG/ML (10ML) SYRINGE FOR IV PUSH (FOR BLOOD PRESSURE SUPPORT)
PREFILLED_SYRINGE | INTRAVENOUS | Status: AC
  Filled 2018-06-16: qty 20

## 2018-06-16 MED ORDER — SENNOSIDES-DOCUSATE SODIUM 8.6-50 MG PO TABS: 1.0000 | ORAL_TABLET | Freq: Every evening | ORAL | Status: DC | PRN

## 2018-06-16 MED ORDER — LIDOCAINE 2% (20 MG/ML) 5 ML SYRINGE
INTRAMUSCULAR | Status: AC
  Filled 2018-06-16: qty 5

## 2018-06-16 MED ORDER — SUGAMMADEX SODIUM 500 MG/5ML IV SOLN
INTRAVENOUS | Status: DC | PRN
  Administered 2018-06-16: 300 mg via INTRAVENOUS

## 2018-06-16 MED ORDER — SUGAMMADEX SODIUM 500 MG/5ML IV SOLN
INTRAVENOUS | Status: AC
  Filled 2018-06-16: qty 5

## 2018-06-16 MED ORDER — ACETAMINOPHEN 500 MG PO TABS
1000.0000 mg | ORAL_TABLET | Freq: Once | ORAL | Status: AC
  Administered 2018-06-16: 1000 mg via ORAL
  Filled 2018-06-16: qty 2

## 2018-06-16 MED ORDER — ROCURONIUM BROMIDE 10 MG/ML (PF) SYRINGE
PREFILLED_SYRINGE | INTRAVENOUS | Status: DC | PRN
  Administered 2018-06-16: 60 mg via INTRAVENOUS

## 2018-06-16 MED ORDER — TRANEXAMIC ACID-NACL 1000-0.7 MG/100ML-% IV SOLN
1000.0000 mg | Freq: Once | INTRAVENOUS | Status: AC
  Administered 2018-06-16: 1000 mg via INTRAVENOUS
  Filled 2018-06-16: qty 100

## 2018-06-16 MED ORDER — TRANDOLAPRIL 2 MG PO TABS
2.0000 mg | ORAL_TABLET | Freq: Every day | ORAL | Status: DC
  Administered 2018-06-16 – 2018-06-17 (×2): 2 mg via ORAL
  Filled 2018-06-16 (×2): qty 1

## 2018-06-16 MED ORDER — FLEET ENEMA 7-19 GM/118ML RE ENEM: 1.0000 | ENEMA | Freq: Once | RECTAL | Status: DC | PRN

## 2018-06-16 MED ORDER — GABAPENTIN 300 MG PO CAPS
600.0000 mg | ORAL_CAPSULE | Freq: Two times a day (BID) | ORAL | Status: DC
  Administered 2018-06-16 – 2018-06-17 (×2): 600 mg via ORAL
  Filled 2018-06-16 (×2): qty 2

## 2018-06-16 MED ORDER — HYDROMORPHONE HCL 1 MG/ML IJ SOLN
INTRAMUSCULAR | Status: AC
  Filled 2018-06-16: qty 2

## 2018-06-16 MED ORDER — ONDANSETRON HCL 4 MG/2ML IJ SOLN
INTRAMUSCULAR | Status: AC
  Filled 2018-06-16: qty 2

## 2018-06-16 MED ORDER — ACETAMINOPHEN 500 MG PO TABS
1000.0000 mg | ORAL_TABLET | Freq: Four times a day (QID) | ORAL | Status: DC
  Administered 2018-06-17 (×2): 1000 mg via ORAL
  Filled 2018-06-16 (×2): qty 2

## 2018-06-16 MED ORDER — CHLORHEXIDINE GLUCONATE 4 % EX LIQD: 60.0000 mL | Freq: Once | CUTANEOUS | Status: DC

## 2018-06-16 MED ORDER — SODIUM CHLORIDE 0.9 % IV SOLN
INTRAVENOUS | Status: DC
  Administered 2018-06-16: 1 mL via INTRAVENOUS

## 2018-06-16 MED ORDER — SUGAMMADEX SODIUM 200 MG/2ML IV SOLN
INTRAVENOUS | Status: AC
  Filled 2018-06-16: qty 2

## 2018-06-16 MED ORDER — DOCUSATE SODIUM 100 MG PO CAPS
100.0000 mg | ORAL_CAPSULE | Freq: Two times a day (BID) | ORAL | Status: DC
  Administered 2018-06-16 – 2018-06-17 (×2): 100 mg via ORAL
  Filled 2018-06-16 (×2): qty 1

## 2018-06-16 MED ORDER — PHENOL 1.4 % MT LIQD: 1.0000 | OROMUCOSAL | Status: DC | PRN

## 2018-06-16 MED ORDER — OXYCODONE HCL 5 MG PO TABS
ORAL_TABLET | ORAL | Status: AC
  Filled 2018-06-16: qty 1

## 2018-06-16 MED ORDER — BUSPIRONE HCL 5 MG PO TABS
5.0000 mg | ORAL_TABLET | Freq: Two times a day (BID) | ORAL | Status: DC
  Administered 2018-06-16 – 2018-06-17 (×2): 5 mg via ORAL
  Filled 2018-06-16 (×2): qty 1

## 2018-06-16 MED ORDER — DEXAMETHASONE SODIUM PHOSPHATE 10 MG/ML IJ SOLN
8.0000 mg | Freq: Once | INTRAMUSCULAR | Status: AC
  Administered 2018-06-16: 8 mg via INTRAVENOUS

## 2018-06-16 MED ORDER — METHOCARBAMOL 500 MG PO TABS: 500.0000 mg | ORAL_TABLET | Freq: Four times a day (QID) | ORAL | Status: DC | PRN

## 2018-06-16 MED ORDER — SODIUM CHLORIDE (PF) 0.9 % IJ SOLN
INTRAMUSCULAR | Status: AC
  Filled 2018-06-16: qty 20

## 2018-06-16 MED ORDER — CEFAZOLIN SODIUM-DEXTROSE 2-4 GM/100ML-% IV SOLN
2.0000 g | INTRAVENOUS | Status: AC
  Administered 2018-06-16: 2 g via INTRAVENOUS
  Filled 2018-06-16: qty 100

## 2018-06-16 MED ORDER — BUPIVACAINE LIPOSOME 1.3 % IJ SUSP
INTRAMUSCULAR | Status: DC | PRN
  Administered 2018-06-16: 20 mL

## 2018-06-16 MED ORDER — BUPIVACAINE-EPINEPHRINE 0.25% -1:200000 IJ SOLN
INTRAMUSCULAR | Status: DC | PRN
  Administered 2018-06-16: 20 mL

## 2018-06-16 MED ORDER — ONDANSETRON HCL 4 MG/2ML IJ SOLN
INTRAMUSCULAR | Status: DC | PRN
  Administered 2018-06-16: 4 mg via INTRAVENOUS

## 2018-06-16 MED ORDER — METHOCARBAMOL 500 MG IVPB - SIMPLE MED
500.0000 mg | Freq: Four times a day (QID) | INTRAVENOUS | Status: DC | PRN
  Administered 2018-06-16: 500 mg via INTRAVENOUS
  Filled 2018-06-16: qty 50

## 2018-06-16 MED ORDER — ONDANSETRON HCL 4 MG/2ML IJ SOLN: 4.0000 mg | Freq: Once | INTRAMUSCULAR | Status: DC | PRN

## 2018-06-16 MED ORDER — VERAPAMIL HCL ER 180 MG PO TBCR
180.0000 mg | EXTENDED_RELEASE_TABLET | Freq: Every day | ORAL | Status: DC
  Administered 2018-06-16 – 2018-06-17 (×2): 180 mg via ORAL
  Filled 2018-06-16 (×2): qty 1

## 2018-06-16 MED ORDER — OXYCODONE HCL 5 MG PO TABS
5.0000 mg | ORAL_TABLET | Freq: Once | ORAL | Status: AC | PRN
  Administered 2018-06-16: 5 mg via ORAL

## 2018-06-16 MED ORDER — TRANEXAMIC ACID-NACL 1000-0.7 MG/100ML-% IV SOLN
1000.0000 mg | INTRAVENOUS | Status: AC
  Administered 2018-06-16: 1000 mg via INTRAVENOUS
  Filled 2018-06-16: qty 100

## 2018-06-16 MED ORDER — OXYCODONE HCL 5 MG/5ML PO SOLN
5.0000 mg | Freq: Once | ORAL | Status: AC | PRN
  Filled 2018-06-16: qty 5

## 2018-06-16 MED ORDER — ALUM & MAG HYDROXIDE-SIMETH 200-200-20 MG/5ML PO SUSP: 30.0000 mL | ORAL | Status: DC | PRN

## 2018-06-16 MED ORDER — CITALOPRAM HYDROBROMIDE 20 MG PO TABS
40.0000 mg | ORAL_TABLET | Freq: Every evening | ORAL | Status: DC
  Administered 2018-06-16: 40 mg via ORAL
  Filled 2018-06-16: qty 2

## 2018-06-16 MED ORDER — OXYBUTYNIN CHLORIDE 5 MG PO TABS
5.0000 mg | ORAL_TABLET | Freq: Two times a day (BID) | ORAL | Status: DC
  Administered 2018-06-16 – 2018-06-17 (×2): 5 mg via ORAL
  Filled 2018-06-16 (×2): qty 1

## 2018-06-16 MED ORDER — EPHEDRINE 5 MG/ML INJ
INTRAVENOUS | Status: AC
  Filled 2018-06-16: qty 10

## 2018-06-16 MED ORDER — DEXAMETHASONE SODIUM PHOSPHATE 10 MG/ML IJ SOLN
INTRAMUSCULAR | Status: AC
  Filled 2018-06-16: qty 1

## 2018-06-16 MED ORDER — BISACODYL 5 MG PO TBEC: 5.0000 mg | DELAYED_RELEASE_TABLET | Freq: Every day | ORAL | Status: DC | PRN

## 2018-06-16 MED ORDER — METOCLOPRAMIDE HCL 5 MG/ML IJ SOLN: 5.0000 mg | Freq: Three times a day (TID) | INTRAMUSCULAR | Status: DC | PRN

## 2018-06-16 MED ORDER — MIDAZOLAM HCL 2 MG/2ML IJ SOLN
1.0000 mg | INTRAMUSCULAR | Status: DC
  Administered 2018-06-16 (×2): 1 mg via INTRAVENOUS
  Filled 2018-06-16: qty 2

## 2018-06-16 MED ORDER — LACTATED RINGERS IV SOLN
INTRAVENOUS | Status: DC
  Administered 2018-06-16 (×2): via INTRAVENOUS

## 2018-06-16 MED ORDER — HYDROMORPHONE HCL 1 MG/ML IJ SOLN: 0.5000 mg | INTRAMUSCULAR | Status: DC | PRN

## 2018-06-16 MED ORDER — GABAPENTIN 300 MG PO CAPS
300.0000 mg | ORAL_CAPSULE | Freq: Once | ORAL | Status: DC
  Filled 2018-06-16: qty 1

## 2018-06-16 MED ORDER — GABAPENTIN 300 MG PO CAPS: 300.0000 mg | ORAL_CAPSULE | Freq: Three times a day (TID) | ORAL | Status: DC

## 2018-06-16 MED ORDER — LIDOCAINE 2% (20 MG/ML) 5 ML SYRINGE
INTRAMUSCULAR | Status: DC | PRN
  Administered 2018-06-16: 60 mg via INTRAVENOUS

## 2018-06-16 MED ORDER — DEXAMETHASONE SODIUM PHOSPHATE 10 MG/ML IJ SOLN
10.0000 mg | Freq: Once | INTRAMUSCULAR | Status: AC
  Administered 2018-06-17: 10 mg via INTRAVENOUS
  Filled 2018-06-16: qty 1

## 2018-06-16 MED ORDER — ONDANSETRON HCL 4 MG PO TABS: 4.0000 mg | ORAL_TABLET | Freq: Four times a day (QID) | ORAL | Status: DC | PRN

## 2018-06-16 MED ORDER — ROCURONIUM BROMIDE 10 MG/ML (PF) SYRINGE
PREFILLED_SYRINGE | INTRAVENOUS | Status: AC
  Filled 2018-06-16: qty 10

## 2018-06-16 MED ORDER — METHOCARBAMOL 500 MG IVPB - SIMPLE MED
INTRAVENOUS | Status: AC
  Filled 2018-06-16: qty 50

## 2018-06-16 MED ORDER — MENTHOL 3 MG MT LOZG: 1.0000 | LOZENGE | OROMUCOSAL | Status: DC | PRN

## 2018-06-16 MED ORDER — FENTANYL CITRATE (PF) 100 MCG/2ML IJ SOLN
25.0000 ug | INTRAMUSCULAR | Status: DC | PRN
  Administered 2018-06-16 (×3): 50 ug via INTRAVENOUS

## 2018-06-16 MED ORDER — STERILE WATER FOR IRRIGATION IR SOLN
Status: DC | PRN
  Administered 2018-06-16: 2000 mL

## 2018-06-16 MED ORDER — OXYCODONE HCL 5 MG PO TABS
5.0000 mg | ORAL_TABLET | ORAL | Status: DC | PRN
  Administered 2018-06-16 – 2018-06-17 (×4): 5 mg via ORAL
  Filled 2018-06-16 (×3): qty 1

## 2018-06-16 MED ORDER — FENTANYL CITRATE (PF) 100 MCG/2ML IJ SOLN
50.0000 ug | INTRAMUSCULAR | Status: DC
  Administered 2018-06-16 (×2): 50 ug via INTRAVENOUS
  Administered 2018-06-16: 100 ug via INTRAVENOUS
  Administered 2018-06-16 (×4): 50 ug via INTRAVENOUS
  Filled 2018-06-16: qty 2

## 2018-06-16 MED ORDER — TRANDOLAPRIL-VERAPAMIL HCL ER 2-180 MG PO TBCR: 1.0000 | EXTENDED_RELEASE_TABLET | Freq: Every evening | ORAL | Status: DC

## 2018-06-16 SURGICAL SUPPLY — 55 items
ARTISURF 10M PLY L 6-9CD KNEE (Knees) ×2 IMPLANT
BAG ZIPLOCK 12X15 (MISCELLANEOUS) ×3 IMPLANT
BANDAGE ACE 6X5 VEL STRL LF (GAUZE/BANDAGES/DRESSINGS) ×3 IMPLANT
BANDAGE ELASTIC 6 VELCRO ST LF (GAUZE/BANDAGES/DRESSINGS) ×2 IMPLANT
BLADE SAGITTAL 13X1.27X60 (BLADE) ×2 IMPLANT
BLADE SAGITTAL 13X1.27X60MM (BLADE) ×1
BLADE SAW SGTL 83.5X18.5 (BLADE) ×3 IMPLANT
BLADE SURG 15 STRL LF DISP TIS (BLADE) ×1 IMPLANT
BLADE SURG 15 STRL SS (BLADE) ×2
BLADE SURG SZ10 CARB STEEL (BLADE) ×6 IMPLANT
BOWL SMART MIX CTS (DISPOSABLE) ×3 IMPLANT
CEMENT BONE SIMPLEX SPEEDSET (Cement) ×6 IMPLANT
CLOSURE WOUND 1/2 X4 (GAUZE/BANDAGES/DRESSINGS) ×2
COVER SURGICAL LIGHT HANDLE (MISCELLANEOUS) ×3 IMPLANT
COVER WAND RF STERILE (DRAPES) IMPLANT
CUFF TOURN SGL QUICK 34 (TOURNIQUET CUFF) ×2
CUFF TRNQT CYL 34X4X40X1 (TOURNIQUET CUFF) ×1 IMPLANT
DECANTER SPIKE VIAL GLASS SM (MISCELLANEOUS) ×6 IMPLANT
DRAPE INCISE IOBAN 66X45 STRL (DRAPES) ×6 IMPLANT
DRAPE U-SHAPE 47X51 STRL (DRAPES) ×3 IMPLANT
DRSG AQUACEL AG ADV 3.5X10 (GAUZE/BANDAGES/DRESSINGS) ×3 IMPLANT
DURAPREP 26ML APPLICATOR (WOUND CARE) ×6 IMPLANT
ELECT REM PT RETURN 15FT ADLT (MISCELLANEOUS) ×3 IMPLANT
FEMORAL KNEE COMPONENT SZ6 LT (Joint) ×2 IMPLANT
GLOVE BIOGEL M STRL SZ7.5 (GLOVE) ×3 IMPLANT
GLOVE BIOGEL PI IND STRL 7.5 (GLOVE) ×1 IMPLANT
GLOVE BIOGEL PI IND STRL 8.5 (GLOVE) ×2 IMPLANT
GLOVE BIOGEL PI INDICATOR 7.5 (GLOVE) ×2
GLOVE BIOGEL PI INDICATOR 8.5 (GLOVE) ×4
GLOVE SURG ORTHO 8.0 STRL STRW (GLOVE) ×9 IMPLANT
GOWN STRL REUS W/ TWL XL LVL3 (GOWN DISPOSABLE) ×2 IMPLANT
GOWN STRL REUS W/TWL XL LVL3 (GOWN DISPOSABLE) ×4
HANDPIECE INTERPULSE COAX TIP (DISPOSABLE) ×2
HOLDER FOLEY CATH W/STRAP (MISCELLANEOUS) ×3 IMPLANT
HOOD PEEL AWAY FLYTE STAYCOOL (MISCELLANEOUS) ×9 IMPLANT
MANIFOLD NEPTUNE II (INSTRUMENTS) ×3 IMPLANT
NS IRRIG 1000ML POUR BTL (IV SOLUTION) ×3 IMPLANT
PACK TOTAL KNEE CUSTOM (KITS) ×3 IMPLANT
PROTECTOR NERVE ULNAR (MISCELLANEOUS) ×3 IMPLANT
SET HNDPC FAN SPRY TIP SCT (DISPOSABLE) ×1 IMPLANT
STEM POLY PAT PLY 32M KNEE (Knees) ×2 IMPLANT
STEM TIBIA 5 DEG SZ D L KNEE (Knees) IMPLANT
STRIP CLOSURE SKIN 1/2X4 (GAUZE/BANDAGES/DRESSINGS) ×3 IMPLANT
SUT BONE WAX W31G (SUTURE) ×3 IMPLANT
SUT MNCRL AB 3-0 PS2 18 (SUTURE) ×3 IMPLANT
SUT STRATAFIX 0 PDS 27 VIOLET (SUTURE) ×3
SUT STRATAFIX PDS+ 0 24IN (SUTURE) ×3 IMPLANT
SUT VIC AB 1 CT1 36 (SUTURE) ×3 IMPLANT
SUTURE STRATFX 0 PDS 27 VIOLET (SUTURE) ×1 IMPLANT
SYR CONTROL 10ML LL (SYRINGE) ×6 IMPLANT
TIBIA STEM 5 DEG SZ D L KNEE (Knees) ×3 IMPLANT
TRAY FOLEY MTR SLVR 16FR STAT (SET/KITS/TRAYS/PACK) ×3 IMPLANT
WATER STERILE IRR 1000ML POUR (IV SOLUTION) ×6 IMPLANT
WRAP KNEE MAXI GEL POST OP (GAUZE/BANDAGES/DRESSINGS) ×3 IMPLANT
YANKAUER SUCT BULB TIP 10FT TU (MISCELLANEOUS) ×3 IMPLANT

## 2018-06-16 NOTE — H&P (Signed)
Tonya Webster MRN:  [id number] DOB/SEX:  Mar 14, 1961/female  CHIEF COMPLAINT:  Painful left Knee  HISTORY: Patient is a 58 y.o. female presented with a history of pain in the left knee. Onset of symptoms was gradual starting a few years ago with gradually worsening course since that time. Patient has been treated conservatively with over-the-counter NSAIDs and activity modification. Patient currently rates pain in the knee at 10 out of 10 with activity. There is pain at night.  PAST MEDICAL HISTORY: Patient Active Problem List   Diagnosis Date Noted  . Unilateral primary osteoarthritis, left knee 03/08/2017  . Unilateral primary osteoarthritis, right knee 03/08/2017   Past Medical History:  Diagnosis Date  . Anemia   . Asthma   . Cellulitis   . Environmental allergies   . Fibromyalgia   . Hypertension   . Hypothyroidism   . MRSA infection   . Pneumonia   . Restless leg   . Sleep apnea   . Thyroid disease    Past Surgical History:  Procedure Laterality Date  . BACK SURGERY    . CESAREAN SECTION    . GASTRIC BYPASS    . JOINT REPLACEMENT     Left total knee arthroplasty Dr. Ronnie Derby 06-16-17     MEDICATIONS:   No medications prior to admission.    ALLERGIES:   Allergies  Allergen Reactions  . Soy Allergy Swelling    REVIEW OF SYSTEMS:  Musculoskeletal:positive for arthralgias and bone pain   FAMILY HISTORY:   Family History  Problem Relation Age of Onset  . Breast cancer Neg Hx     SOCIAL HISTORY:   Social History   Tobacco Use  . Smoking status: Never Smoker  . Smokeless tobacco: Never Used  Substance Use Topics  . Alcohol use: No     EXAMINATION:  Vital signs in last 24 hours:    LMP 07/27/2013   General Appearance:    Alert, cooperative, no distress, appears stated age  Head:    Normocephalic, without obvious abnormality, atraumatic  Eyes:    PERRL, conjunctiva/corneas clear, EOM's intact, fundi    benign, both eyes  Ears:    Normal TM's and  external ear canals, both ears  Nose:   Nares normal, septum midline, mucosa normal, no drainage    or sinus tenderness  Throat:   Lips, mucosa, and tongue normal; teeth and gums normal  Neck:   Supple, symmetrical, trachea midline, no adenopathy;    thyroid:  no enlargement/tenderness/nodules; no carotid   bruit or JVD  Back:     Symmetric, no curvature, ROM normal, no CVA tenderness  Lungs:     Clear to auscultation bilaterally, respirations unlabored  Chest Wall:    No tenderness or deformity   Heart:    Regular rate and rhythm, S1 and S2 normal, no murmur, rub   or gallop  Breast Exam:    No tenderness, masses, or nipple abnormality  Abdomen:     Soft, non-tender, bowel sounds active all four quadrants,    no masses, no organomegaly  Genitalia:    Normal female without lesion, discharge or tenderness  Rectal:    Normal tone, no masses or tenderness;   guaiac negative stool  Extremities:   Extremities normal, atraumatic, no cyanosis or edema  Pulses:   2+ and symmetric all extremities  Skin:   Skin color, texture, turgor normal, no rashes or lesions  Lymph nodes:   Cervical, supraclavicular, and axillary nodes normal  Neurologic:  CNII-XII intact, normal strength, sensation and reflexes    throughout    Musculoskeletal:  ROM 0-120, Ligaments intact,  Imaging Review Plain radiographs demonstrate severe degenerative joint disease of the left knee. The overall alignment is neutral. The bone quality appears to be good for age and reported activity level.  Assessment/Plan: Primary osteoarthritis, left knee   The patient history, physical examination and imaging studies are consistent with advanced degenerative joint disease of the left knee. The patient has failed conservative treatment.  The clearance notes were reviewed.  After discussion with the patient it was felt that Total Knee Replacement was indicated. The procedure,  risks, and benefits of total knee arthroplasty were  presented and reviewed. The risks including but not limited to aseptic loosening, infection, blood clots, vascular injury, stiffness, patella tracking problems complications among others were discussed. The patient acknowledged the explanation, agreed to proceed with the plan.  Preoperative templating of the joint replacement has been completed, documented, and submitted to the Operating Room personnel in order to optimize intra-operative equipment management.    Patient's anticipated LOS is less than 2 midnights, meeting these requirements: - Younger than 53  - Lives within 1 hour of care - Has a competent adult at home to recover with post-op recover - NO history of  - Chronic pain requiring opiods  - Diabetes  - Coronary Artery Disease  - Heart failure  - Heart attack  - Stroke  - DVT/VTE  - Cardiac arrhythmia  - Respiratory Failure/COPD  - Renal failure  - Anemia  - Advanced Liver disease       Donia Ast 06/16/2018, 9:00 AM

## 2018-06-16 NOTE — Anesthesia Procedure Notes (Signed)
Anesthesia Regional Block: Adductor canal block   Pre-Anesthetic Checklist: ,, timeout performed, Correct Patient, Correct Site, Correct Laterality, Correct Procedure, Correct Position, site marked, Risks and benefits discussed, pre-op evaluation,  At surgeon's request and post-op pain management  Laterality: Left  Prep: Maximum Sterile Barrier Precautions used, chloraprep       Needles:  Injection technique: Single-shot  Needle Type: Echogenic Stimulator Needle     Needle Length: 9cm  Needle Gauge: 21     Additional Needles:   Procedures:,,,, ultrasound used (permanent image in chart),,,,  Narrative:  Start time: 06/16/2018 2:00 PM End time: 06/16/2018 2:10 PM Injection made incrementally with aspirations every 5 mL.  Performed by: Personally  Anesthesiologist: Roberts Gaudy, MD  Additional Notes: 25 cc 0.5% Bupivacaine with 1:200 epi injected easily

## 2018-06-16 NOTE — Transfer of Care (Signed)
Immediate Anesthesia Transfer of Care Note  Patient: Tonya Webster  Procedure(s) Performed: TOTAL KNEE ARTHROPLASTY (Left Knee)  Patient Location: PACU  Anesthesia Type:General  Level of Consciousness: awake and alert   Airway & Oxygen Therapy: Patient Spontanous Breathing and Patient connected to face mask oxygen  Post-op Assessment: Report given to RN and Post -op Vital signs reviewed and stable  Post vital signs: Reviewed and stable  Last Vitals:  Vitals Value Taken Time  BP 170/99 06/16/2018  3:02 PM  Temp    Pulse 74 06/16/2018  3:03 PM  Resp 9 06/16/2018  3:03 PM  SpO2 100 % 06/16/2018  3:03 PM  Vitals shown include unvalidated device data.  Last Pain:  Vitals:   06/16/18 1310  TempSrc:   PainSc: 0-No pain      Patients Stated Pain Goal: 4 (54/49/20 1007)  Complications: No apparent anesthesia complications

## 2018-06-16 NOTE — Anesthesia Procedure Notes (Signed)
Date/Time: 06/16/2018 2:56 PM Performed by: Cynda Familia, CRNA Oxygen Delivery Method: Simple face mask Placement Confirmation: positive ETCO2 and breath sounds checked- equal and bilateral Dental Injury: Teeth and Oropharynx as per pre-operative assessment

## 2018-06-16 NOTE — Anesthesia Procedure Notes (Signed)
Procedure Name: Intubation Date/Time: 06/16/2018 1:29 PM Performed by: Claudia Desanctis, CRNA Pre-anesthesia Checklist: Patient identified, Emergency Drugs available, Suction available and Patient being monitored Patient Re-evaluated:Patient Re-evaluated prior to induction Oxygen Delivery Method: Circle system utilized Preoxygenation: Pre-oxygenation with 100% oxygen Induction Type: IV induction Ventilation: Mask ventilation without difficulty Laryngoscope Size: 2 and Miller Grade View: Grade I Tube type: Oral Tube size: 7.5 mm Number of attempts: 1 Airway Equipment and Method: Stylet Placement Confirmation: ETT inserted through vocal cords under direct vision,  positive ETCO2 and breath sounds checked- equal and bilateral Secured at: 21 cm Tube secured with: Tape Dental Injury: Teeth and Oropharynx as per pre-operative assessment

## 2018-06-16 NOTE — Anesthesia Postprocedure Evaluation (Signed)
Anesthesia Post Note  Patient: Tremeka Helbling Aune  Procedure(s) Performed: TOTAL KNEE ARTHROPLASTY (Left Knee)     Patient location during evaluation: PACU Anesthesia Type: General Level of consciousness: awake and alert Pain management: pain level controlled Vital Signs Assessment: post-procedure vital signs reviewed and stable Respiratory status: spontaneous breathing, nonlabored ventilation, respiratory function stable and patient connected to nasal cannula oxygen Cardiovascular status: blood pressure returned to baseline and stable Postop Assessment: no apparent nausea or vomiting Anesthetic complications: no    Last Vitals:  Vitals:   06/16/18 1502 06/16/18 1515  BP: (!) 170/99 (!) 189/98  Pulse: 77 63  Resp: 10 (!) 8  Temp: 36.8 C   SpO2: 100% 100%    Last Pain:  Vitals:   06/16/18 1515  TempSrc:   PainSc: 8                  Kalena Mander COKER

## 2018-06-16 NOTE — Progress Notes (Signed)
AssistedDr. Joslin with left, ultrasound guided, adductor canal block. Side rails up, monitors on throughout procedure. See vital signs in flow sheet. Tolerated Procedure well.  

## 2018-06-16 NOTE — Anesthesia Preprocedure Evaluation (Signed)
Anesthesia Evaluation  Patient identified by MRN, date of birth, ID band Patient awake    Reviewed: Allergy & Precautions, NPO status , Patient's Chart, lab work & pertinent test results  Airway Mallampati: II  TM Distance: >3 FB Neck ROM: Full    Dental  (+) Teeth Intact, Dental Advisory Given   Pulmonary    breath sounds clear to auscultation       Cardiovascular hypertension,  Rhythm:Regular Rate:Normal     Neuro/Psych    GI/Hepatic   Endo/Other    Renal/GU      Musculoskeletal   Abdominal   Peds  Hematology   Anesthesia Other Findings   Reproductive/Obstetrics                             Anesthesia Physical Anesthesia Plan  ASA: III  Anesthesia Plan: General   Post-op Pain Management:  Regional for Post-op pain   Induction:   PONV Risk Score and Plan: Ondansetron and Dexamethasone  Airway Management Planned: Oral ETT  Additional Equipment:   Intra-op Plan:   Post-operative Plan: Extubation in OR  Informed Consent: I have reviewed the patients History and Physical, chart, labs and discussed the procedure including the risks, benefits and alternatives for the proposed anesthesia with the patient or authorized representative who has indicated his/her understanding and acceptance.   Dental advisory given  Plan Discussed with: CRNA and Anesthesiologist  Anesthesia Plan Comments: (Plan GA per patient request, adductor canal block for supplementary pain control.)        Anesthesia Quick Evaluation

## 2018-06-17 ENCOUNTER — Encounter (HOSPITAL_COMMUNITY): Payer: Self-pay | Admitting: Orthopedic Surgery

## 2018-06-17 DIAGNOSIS — Z9884 Bariatric surgery status: Secondary | ICD-10-CM | POA: Diagnosis not present

## 2018-06-17 DIAGNOSIS — Z8614 Personal history of Methicillin resistant Staphylococcus aureus infection: Secondary | ICD-10-CM | POA: Diagnosis not present

## 2018-06-17 DIAGNOSIS — M1712 Unilateral primary osteoarthritis, left knee: Secondary | ICD-10-CM | POA: Diagnosis not present

## 2018-06-17 DIAGNOSIS — I110 Hypertensive heart disease with heart failure: Secondary | ICD-10-CM | POA: Diagnosis not present

## 2018-06-17 DIAGNOSIS — G4730 Sleep apnea, unspecified: Secondary | ICD-10-CM | POA: Diagnosis not present

## 2018-06-17 DIAGNOSIS — M25762 Osteophyte, left knee: Secondary | ICD-10-CM | POA: Diagnosis not present

## 2018-06-17 LAB — CBC
HCT: 33.8 % — ABNORMAL LOW (ref 36.0–46.0)
Hemoglobin: 10.9 g/dL — ABNORMAL LOW (ref 12.0–15.0)
MCH: 31 pg (ref 26.0–34.0)
MCHC: 32.2 g/dL (ref 30.0–36.0)
MCV: 96 fL (ref 80.0–100.0)
Platelets: 260 10*3/uL (ref 150–400)
RBC: 3.52 MIL/uL — ABNORMAL LOW (ref 3.87–5.11)
RDW: 12.6 % (ref 11.5–15.5)
WBC: 9.7 10*3/uL (ref 4.0–10.5)
nRBC: 0 % (ref 0.0–0.2)

## 2018-06-17 LAB — BASIC METABOLIC PANEL
Anion gap: 7 (ref 5–15)
BUN: 11 mg/dL (ref 6–20)
CO2: 26 mmol/L (ref 22–32)
CREATININE: 0.76 mg/dL (ref 0.44–1.00)
Calcium: 9.1 mg/dL (ref 8.9–10.3)
Chloride: 105 mmol/L (ref 98–111)
GFR calc Af Amer: >60 mL/min (ref >60)
GFR calc non Af Amer: >60 mL/min (ref >60)
Glucose, Bld: 139 mg/dL — ABNORMAL HIGH (ref 70–99)
Potassium: 4.6 mmol/L (ref 3.5–5.1)
Sodium: 138 mmol/L (ref 135–145)

## 2018-06-17 MED ORDER — METHOCARBAMOL 500 MG PO TABS: 500.0000 mg | ORAL_TABLET | Freq: Four times a day (QID) | ORAL | 0 refills | Status: DC | PRN

## 2018-06-17 MED ORDER — OXYCODONE HCL 5 MG PO TABS: 5.0000 mg | ORAL_TABLET | Freq: Four times a day (QID) | ORAL | 0 refills | Status: DC | PRN

## 2018-06-17 MED ORDER — ASPIRIN 325 MG PO TBEC: 325.0000 mg | DELAYED_RELEASE_TABLET | Freq: Two times a day (BID) | ORAL | 0 refills | Status: DC

## 2018-06-17 NOTE — Care Management Note (Signed)
Case Management Note  Patient Details  Name: Tonya Webster MRN: [id number] Date of Birth: 1961/01/31  Subjective/Objective:      Discharge planning, spoke with patient at bedside. Have chosen [facility], evaluate and treat.   Action/Plan: Contacted [facility] for referral. They have accepted. Needs RW, contacted [facility] to deliver to room. [phone]               Expected Discharge Date:  06/17/18               Expected Discharge Plan:  [place]  In-House Referral:  NA  Discharge planning Services  CM Consult  Post Acute Care Choice:  Home Health, Durable Medical Equipment Choice offered to:  Patient  DME Arranged:  Walker rolling DME Agency:  [place]:  PT [place] Agency:  [facility] ([facility]  Status of Service:  Completed, signed off  If discussed at [employer] of Stay Meetings, dates discussed:    Additional Comments:  Guadalupe Maple, RN 06/17/2018, 11:03 AM

## 2018-06-17 NOTE — Progress Notes (Signed)
SPORTS MEDICINE AND JOINT REPLACEMENT  Lara Mulch, MD    Carlyon Shadow, PA-C [place], [place],   [address]                             [phone]   PROGRESS NOTE  Subjective:  negative for Chest Pain  negative for Shortness of Breath  negative for Nausea/Vomiting   negative for Calf Pain  negative for Bowel Movement   Tolerating Diet: yes         Patient reports pain as 3 on 0-10 scale.    Objective: Vital signs in last 24 hours:    Patient Vitals for the past 24 hrs:  BP Temp Temp src Pulse Resp SpO2 Height Weight  06/17/18 0402 (!) 158/87 97.8 F (36.6 C) Oral (!) 55 16 98 % - -  06/17/18 0100 (!) 157/94 97.7 F (36.5 C) Oral 82 16 97 % - -  06/16/18 2119 (!) 161/85 97.7 F (36.5 C) - 68 20 99 % - -  06/16/18 2030 - - - - - 96 % - -  06/16/18 1850 (!) 171/85 97.8 F (36.6 C) - 83 16 95 % - -  06/16/18 1750 (!) 160/90 98.2 F (36.8 C) - 75 18 97 % - -  06/16/18 1700 (!) 158/86 98 F (36.7 C) - 86 15 98 % - -  06/16/18 1645 (!) 161/84 - - 65 17 100 % - -  06/16/18 1630 (!) 169/98 - - 69 18 100 % - -  06/16/18 1615 (!) 177/88 - - 66 10 100 % - -  06/16/18 1600 (!) 175/89 98.2 F (36.8 C) - 84 16 100 % - -  06/16/18 1545 (!) 160/86 - - 77 10 97 % - -  06/16/18 1530 (!) 142/101 - - 69 18 96 % - -  06/16/18 1515 (!) 189/98 - - 63 (!) 8 100 % - -  06/16/18 1502 (!) 170/99 98.2 F (36.8 C) - 77 10 100 % - -  06/16/18 1311 (!) 142/64 - - 71 17 100 % - -  06/16/18 1310 - - - 69 14 100 % - -  06/16/18 1305 (!) 156/81 - - (!) 54 (!) 8 100 % - -  06/16/18 1300 (!) 158/79 - - (!) 55 13 100 % - -  06/16/18 1005 - - - - - - 5\' 4"  (1.626 m) 103.9 kg  06/16/18 0952 (!) 164/74 98.7 F (37.1 C) Oral 60 14 97 % - -    @flow {1959:LAST@   Intake/Output from previous day:   01/06 0701 - 01/07 0700 In: 3300 [I.V.:2900] Out: 1075 [Urine:1050]   Intake/Output this shift:   No intake/output data recorded.   Intake/Output      01/06 0701 - 01/07 0700  01/07 0701 - 01/08 0700   I.V. (mL/kg) 2900 (27.9)    IV Piggyback 400    Total Intake(mL/kg) 3300 (31.8)    Urine (mL/kg/hr) 1050    Stool 0    Blood 25    Total Output 1075    Net +2225         Urine Occurrence 4 x    Stool Occurrence 0 x       LABORATORY DATA: Recent Labs    06/17/18 0346  WBC 9.7  HGB 10.9*  HCT 33.8*  PLT 260   Recent Labs    06/17/18 0346  NA 138  K  4.6  CL 105  CO2 26  BUN 11  CREATININE 0.76  GLUCOSE 139*  CALCIUM 9.1   Lab Results  Component Value Date   INR 1.1 03/27/2008    Examination:  General appearance: alert, cooperative and no distress Extremities: extremities normal, atraumatic, no cyanosis or edema  Wound Exam: clean, dry, intact   Drainage:  None: wound tissue dry  Motor Exam: Quadriceps and Hamstrings Intact  Sensory Exam: Superficial Peroneal, Deep Peroneal and Tibial normal   Assessment:    1 Day Post-Op  Procedure(s) (LRB): TOTAL KNEE ARTHROPLASTY (Left)  ADDITIONAL DIAGNOSIS:  Active Problems:   S/P total knee replacement     Plan: Physical Therapy as ordered Weight Bearing as Tolerated (WBAT)  DVT Prophylaxis:  Aspirin  DISCHARGE PLAN: Home  DISCHARGE NEEDS: HHPT       Patient's anticipated LOS is less than 2 midnights, meeting these requirements: - Younger than 70 - Lives within 1 hour of care - Has a competent adult at home to recover with post-op recover - NO history of  - Chronic pain requiring opiods  - Diabetes  - Coronary Artery Disease  - Heart failure  - Heart attack  - Stroke  - DVT/VTE  - Cardiac arrhythmia  - Respiratory Failure/COPD  - Renal failure  - Anemia  - Advanced Liver disease        Donia Ast 06/17/2018, 7:24 AM

## 2018-06-17 NOTE — Discharge Summary (Signed)
SPORTS MEDICINE & JOINT REPLACEMENT   Lara Mulch, MD   Carlyon Shadow, PA-C [place], [place], [place]  [address]                             [phone]  PATIENT ID: Tonya Webster        MRN:  [id number]          DOB/AGE: 60-Dec-1962 / 58 y.o.    DISCHARGE SUMMARY  ADMISSION DATE:    06/16/2018 DISCHARGE DATE:   06/17/2018   ADMISSION DIAGNOSIS: LT. KNEE OSTEOARTHRITIS    DISCHARGE DIAGNOSIS:  LT. KNEE OSTEOARTHRITIS    ADDITIONAL DIAGNOSIS: Active Problems:   S/P total knee replacement  Past Medical History:  Diagnosis Date  . Anemia   . Asthma   . Cellulitis   . Environmental allergies   . Fibromyalgia   . Hypertension   . Hypothyroidism   . MRSA infection   . Pneumonia   . Restless leg   . Sleep apnea   . Thyroid disease     PROCEDURE: Procedure(s): TOTAL KNEE ARTHROPLASTY on 06/16/2018  CONSULTS:    HISTORY:  See H&P in chart  HOSPITAL COURSE:  ALIEAH BRINTON is a 58 y.o. admitted on 06/16/2018 and found to have a diagnosis of LT. KNEE OSTEOARTHRITIS.  After appropriate laboratory studies were obtained  they were taken to the operating room on 06/16/2018 and underwent Procedure(s): TOTAL KNEE ARTHROPLASTY.   They were given perioperative antibiotics:  Anti-infectives (From admission, onward)   Start     Dose/Rate Route Frequency Ordered Stop   06/16/18 1930  ceFAZolin (ANCEF) IVPB 2g/100 mL premix     2 g 200 mL/hr over 30 Minutes Intravenous Every 6 hours 06/16/18 1817 06/17/18 0144   06/16/18 0945  ceFAZolin (ANCEF) IVPB 2g/100 mL premix     2 g 200 mL/hr over 30 Minutes Intravenous On call to O.R. 06/16/18 6440 06/16/18 1332    .  Patient given tranexamic acid IV or topical and exparel intra-operatively.  Tolerated the procedure well.    POD# 1: Vital signs were stable.  Patient denied Chest pain, shortness of breath, or calf pain.  Patient was started on Aspirin twice daily at 8am.  Consults to PT, OT, and care management were made.  The  patient was weight bearing as tolerated.  CPM was placed on the operative leg 0-90 degrees for 6-8 hours a day. When out of the CPM, patient was placed in the foam block to achieve full extension. Incentive spirometry was taught.  Dressing was changed.       POD #2, Continued  PT for ambulation and exercise program.  IV saline locked.  O2 discontinued.    The remainder of the hospital course was dedicated to ambulation and strengthening.   The patient was discharged on 1 Day Post-Op in  Good condition.  Blood products given:none  DIAGNOSTIC STUDIES: Recent vital signs:  Patient Vitals for the past 24 hrs:  BP Temp Temp src Pulse Resp SpO2 Height Weight  06/17/18 0402 (!) 158/87 97.8 F (36.6 C) Oral (!) 55 16 98 % - -  06/17/18 0100 (!) 157/94 97.7 F (36.5 C) Oral 82 16 97 % - -  06/16/18 2119 (!) 161/85 97.7 F (36.5 C) - 68 20 99 % - -  06/16/18 2030 - - - - - 96 % - -  06/16/18 1850 (!) 171/85 97.8 F (36.6 C) -  83 16 95 % - -  06/16/18 1750 (!) 160/90 98.2 F (36.8 C) - 75 18 97 % - -  06/16/18 1700 (!) 158/86 98 F (36.7 C) - 86 15 98 % - -  06/16/18 1645 (!) 161/84 - - 65 17 100 % - -  06/16/18 1630 (!) 169/98 - - 69 18 100 % - -  06/16/18 1615 (!) 177/88 - - 66 10 100 % - -  06/16/18 1600 (!) 175/89 98.2 F (36.8 C) - 84 16 100 % - -  06/16/18 1545 (!) 160/86 - - 77 10 97 % - -  06/16/18 1530 (!) 142/101 - - 69 18 96 % - -  06/16/18 1515 (!) 189/98 - - 63 (!) 8 100 % - -  06/16/18 1502 (!) 170/99 98.2 F (36.8 C) - 77 10 100 % - -  06/16/18 1311 (!) 142/64 - - 71 17 100 % - -  06/16/18 1310 - - - 69 14 100 % - -  06/16/18 1305 (!) 156/81 - - (!) 54 (!) 8 100 % - -  06/16/18 1300 (!) 158/79 - - (!) 55 13 100 % - -  06/16/18 1005 - - - - - - 5\' 4"  (1.626 m) 103.9 kg  06/16/18 0952 (!) 164/74 98.7 F (37.1 C) Oral 60 14 97 % - -       Recent laboratory studies: Recent Labs    06/17/18 0346  WBC 9.7  HGB 10.9*  HCT 33.8*  PLT 260   Recent Labs     06/17/18 0346  NA 138  K 4.6  CL 105  CO2 26  BUN 11  CREATININE 0.76  GLUCOSE 139*  CALCIUM 9.1   Lab Results  Component Value Date   INR 1.1 03/27/2008     Recent Radiographic Studies :  Dg Chest 2 View  Result Date: 05/27/2018 CLINICAL DATA:  Preop for knee surgery EXAM: CHEST - 2 VIEW COMPARISON:  Chest x-ray of 03/27/2008 FINDINGS: No active infiltrate or effusion is seen. Mediastinal and hilar contours are unremarkable. The heart is within upper limits of normal. There are degenerative changes throughout the thoracic spine. IMPRESSION: No active lung disease.  Heart upper normal. Electronically Signed   By: Ivar Drape M.D.   On: 05/27/2018 08:59    DISCHARGE INSTRUCTIONS: Discharge Instructions    Call MD / Call 911   Complete by:  As directed    If you experience chest pain or shortness of breath, CALL 911 and be transported to the hospital emergency room.  If you develope a fever above 101 F, pus (white drainage) or increased drainage or redness at the wound, or calf pain, call your surgeon's office.   Constipation Prevention   Complete by:  As directed    Drink plenty of fluids.  Prune juice may be helpful.  You may use a stool softener, such as Colace (over the counter) 100 mg twice a day.  Use MiraLax (over the counter) for constipation as needed.   Diet - low sodium heart healthy   Complete by:  As directed    Discharge instructions   Complete by:  As directed    INSTRUCTIONS AFTER JOINT REPLACEMENT   Remove items at home which could result in a fall. This includes throw rugs or furniture in walking pathways ICE to the affected joint every three hours while awake for 30 minutes at a time, for at least the first 3-5 days, and then  as needed for pain and swelling.  Continue to use ice for pain and swelling. You may notice swelling that will progress down to the foot and ankle.  This is normal after surgery.  Elevate your leg when you are not up walking on it.    Continue to use the breathing machine you got in the hospital (incentive spirometer) which will help keep your temperature down.  It is common for your temperature to cycle up and down following surgery, especially at night when you are not up moving around and exerting yourself.  The breathing machine keeps your lungs expanded and your temperature down.   DIET:  As you were doing prior to hospitalization, we recommend a well-balanced diet.  DRESSING / WOUND CARE / SHOWERING  Keep the surgical dressing until follow up.  The dressing is water proof, so you can shower without any extra covering.  IF THE DRESSING FALLS OFF or the wound gets wet inside, change the dressing with sterile gauze.  Please use good hand washing techniques before changing the dressing.  Do not use any lotions or creams on the incision until instructed by your surgeon.    ACTIVITY  Increase activity slowly as tolerated, but follow the weight bearing instructions below.   No driving for 6 weeks or until further direction given by your physician.  You cannot drive while taking narcotics.  No lifting or carrying greater than 10 lbs. until further directed by your surgeon. Avoid periods of inactivity such as sitting longer than an hour when not asleep. This helps prevent blood clots.  You may return to work once you are authorized by your doctor.     WEIGHT BEARING   Weight bearing as tolerated with assist device (walker, cane, etc) as directed, use it as long as suggested by your surgeon or therapist, typically at least 4-6 weeks.   EXERCISES  Results after joint replacement surgery are often greatly improved when you follow the exercise, range of motion and muscle strengthening exercises prescribed by your doctor. Safety measures are also important to protect the joint from further injury. Any time any of these exercises cause you to have increased pain or swelling, decrease what you are doing until you are comfortable  again and then slowly increase them. If you have problems or questions, call your caregiver or physical therapist for advice.   Rehabilitation is important following a joint replacement. After just a few days of immobilization, the muscles of the leg can become weakened and shrink (atrophy).  These exercises are designed to build up the tone and strength of the thigh and leg muscles and to improve motion. Often times heat used for twenty to thirty minutes before working out will loosen up your tissues and help with improving the range of motion but do not use heat for the first two weeks following surgery (sometimes heat can increase post-operative swelling).   These exercises can be done on a training (exercise) mat, on the floor, on a table or on a bed. Use whatever works the best and is most comfortable for you.    Use music or television while you are exercising so that the exercises are a pleasant break in your day. This will make your life better with the exercises acting as a break in your routine that you can look forward to.   Perform all exercises about fifteen times, three times per day or as directed.  You should exercise both the operative leg and the other  leg as well.   Exercises include:   Quad Sets - Tighten up the muscle on the front of the thigh (Quad) and hold for 5-10 seconds.   Straight Leg Raises - With your knee straight (if you were given a brace, keep it on), lift the leg to 60 degrees, hold for 3 seconds, and slowly lower the leg.  Perform this exercise against resistance later as your leg gets stronger.  Leg Slides: Lying on your back, slowly slide your foot toward your buttocks, bending your knee up off the floor (only go as far as is comfortable). Then slowly slide your foot back down until your leg is flat on the floor again.  Angel Wings: Lying on your back spread your legs to the side as far apart as you can without causing discomfort.  Hamstring Strength:  Lying on your  back, push your heel against the floor with your leg straight by tightening up the muscles of your buttocks.  Repeat, but this time bend your knee to a comfortable angle, and push your heel against the floor.  You may put a pillow under the heel to make it more comfortable if necessary.   A rehabilitation program following joint replacement surgery can speed recovery and prevent re-injury in the future due to weakened muscles. Contact your doctor or a physical therapist for more information on knee rehabilitation.    CONSTIPATION  Constipation is defined medically as fewer than three stools per week and severe constipation as less than one stool per week.  Even if you have a regular bowel pattern at home, your normal regimen is likely to be disrupted due to multiple reasons following surgery.  Combination of anesthesia, postoperative narcotics, change in appetite and fluid intake all can affect your bowels.   YOU MUST use at least one of the following options; they are listed in order of increasing strength to get the job done.  They are all available over the counter, and you may need to use some, POSSIBLY even all of these options:    Drink plenty of fluids (prune juice may be helpful) and high fiber foods Colace 100 mg by mouth twice a day  Senokot for constipation as directed and as needed Dulcolax (bisacodyl), take with full glass of water  Miralax (polyethylene glycol) once or twice a day as needed.  If you have tried all these things and are unable to have a bowel movement in the first 3-4 days after surgery call either your surgeon or your primary doctor.    If you experience loose stools or diarrhea, hold the medications until you stool forms back up.  If your symptoms do not get better within 1 week or if they get worse, check with your doctor.  If you experience "the worst abdominal pain ever" or develop nausea or vomiting, please contact the office immediately for further  recommendations for treatment.   ITCHING:  If you experience itching with your medications, try taking only a single pain pill, or even half a pain pill at a time.  You can also use Benadryl over the counter for itching or also to help with sleep.   TED HOSE STOCKINGS:  Use stockings on both legs until for at least 2 weeks or as directed by physician office. They may be removed at night for sleeping.  MEDICATIONS:  See your medication summary on the "After Visit Summary" that nursing will review with you.  You may have some home medications which will  be placed on hold until you complete the course of blood thinner medication.  It is important for you to complete the blood thinner medication as prescribed.  PRECAUTIONS:  If you experience chest pain or shortness of breath - call 911 immediately for transfer to the hospital emergency department.   If you develop a fever greater that 101 F, purulent drainage from wound, increased redness or drainage from wound, foul odor from the wound/dressing, or calf pain - CONTACT YOUR SURGEON.                                                   FOLLOW-UP APPOINTMENTS:  If you do not already have a post-op appointment, please call the office for an appointment to be seen by your surgeon.  Guidelines for how soon to be seen are listed in your "After Visit Summary", but are typically between 1-4 weeks after surgery.  OTHER INSTRUCTIONS:   Knee Replacement:  Do not place pillow under knee, focus on keeping the knee straight while resting. CPM instructions: 0-90 degrees, 2 hours in the morning, 2 hours in the afternoon, and 2 hours in the evening. Place foam block, curve side up under heel at all times except when in CPM or when walking.  DO NOT modify, tear, cut, or change the foam block in any way.  MAKE SURE YOU:  Understand these instructions.  Get help right away if you are not doing well or get worse.    Thank you for letting us be a part of your medical  care team.  It is a privilege we respect greatly.  We hope these instructions will help you stay on track for a fast and full recovery!   Increase activity slowly as tolerated   Complete by:  As directed       DISCHARGE MEDICATIONS:   Allergies as of 06/17/2018      Reactions   Soy Allergy Anaphylaxis   Itching, and swelling of throat    Dust Mite Extract Other (See Comments)   Dust ragweed, mold, pollen-environmental allergies       Medication List    TAKE these medications   acetaminophen 650 MG CR tablet Commonly known as:  TYLENOL Take 1,300 mg by mouth 2 (two) times daily.   aspirin 325 MG EC tablet Take 1 tablet (325 mg total) by mouth 2 (two) times daily.   budesonide-formoterol 160-4.5 MCG/ACT inhaler Commonly known as:  SYMBICORT Inhale 2 puffs into the lungs 2 (two) times daily as needed (for respiratory issues.).   busPIRone 5 MG tablet Commonly known as:  BUSPAR Take 5 mg by mouth 2 (two) times daily.   citalopram 40 MG tablet Commonly known as:  CELEXA Take 40 mg by mouth every evening.   gabapentin 600 MG tablet Commonly known as:  NEURONTIN Take 600 mg by mouth 2 (two) times daily.   levothyroxine 125 MCG tablet Commonly known as:  SYNTHROID, LEVOTHROID Take 125 mcg by mouth daily before breakfast.   methocarbamol 500 MG tablet Commonly known as:  ROBAXIN Take 1-2 tablets (500-1,000 mg total) by mouth every 6 (six) hours as needed for muscle spasms.   oxybutynin 5 MG tablet Commonly known as:  DITROPAN Take 5 mg by mouth 2 (two) times daily.   oxyCODONE 5 MG immediate release tablet Commonly known as:  Oxy  IR/ROXICODONE Take 1-2 tablets (5-10 mg total) by mouth every 6 (six) hours as needed for moderate pain (pain score 4-6).   traMADol 50 MG tablet Commonly known as:  ULTRAM Take 1-2 tablets three times daily as needed for pain. What changed:    how much to take  how to take this  when to take this  reasons to take this  additional  instructions   trandolapril-verapamil 2-180 MG tablet Commonly known as:  TARKA Take 1 tablet by mouth every evening.   Vitamin D (Ergocalciferol) 1.25 MG (50000 UT) Caps capsule Commonly known as:  DRISDOL Take 50,000 Units by mouth every Friday.            Durable Medical Equipment  (From admission, onward)         Start     Ordered   06/16/18 2133  DME Walker rolling  Once    Question:  Patient needs a walker to treat with the following condition  Answer:  S/P total knee replacement   06/16/18 2132   06/16/18 2133  DME 3 n 1  Once     06/16/18 2132   06/16/18 2133  DME Bedside commode  Once    Question:  Patient needs a bedside commode to treat with the following condition  Answer:  S/P total knee replacement   06/16/18 2132          FOLLOW UP VISIT:    DISPOSITION: HOME VS. SNF  CONDITION:  Good   Donia Ast 06/17/2018, 7:27 AM

## 2018-06-17 NOTE — Progress Notes (Signed)
At 1030, the pt was provided with d/c instructions. After discussing the pt's plan of care upon d/c home, the pt reported no further questions or concerns. Pt is currently waiting for her ride to arrive.

## 2018-06-17 NOTE — Care Management Obs Status (Signed)
[facility] NOTIFICATION   Patient Details  Name: GRACE VALLEY MRN: [id number] Date of Birth: 05-23-1961   Medicare Observation Status Notification Given:  Yes    Guadalupe Maple, RN 06/17/2018, 11:01 AM

## 2018-06-17 NOTE — Evaluation (Signed)
Physical Therapy Evaluation Patient Details Name: Tonya Webster MRN: [id number] DOB: 1960-11-03 Today's Date: 06/17/2018   History of Present Illness  58 yo female s/p L TKA 06/16/17. Hx of fibromyalgia, cellulitis.   Clinical Impression  On eval, pt was Min guard-Min assist for mobility. She walked ~150 feet with a RW. Moderate pain with activity. Reviewed/practiced exercises, gait training, and stair training. All education completed. Okay to d/c from PT standpoint-made RN aware.     Follow Up Recommendations Follow surgeon's recommendation for DC plan and follow-up therapies    Equipment Recommendations  Rolling walker with 5" wheels(if pt decides she wants one. )    Recommendations for Other Services       Precautions / Restrictions Precautions Precautions: Fall Restrictions Weight Bearing Restrictions: No Other Position/Activity Restrictions: WBAT      Mobility  Bed Mobility Overal bed mobility: Needs Assistance Bed Mobility: Supine to Sit     Supine to sit: Min assist;HOB elevated     General bed mobility comments: Small amount of assist for L LE.   Transfers Overall transfer level: Needs assistance Equipment used: Rolling walker (2 wheeled) Transfers: Sit to/from Stand Sit to Stand: Min guard         General transfer comment: Close guard for safety. VCs safety, hand/LE placement.   Ambulation/Gait Ambulation/Gait assistance: Min guard Gait Distance (Feet): 150 Feet Assistive device: Rolling walker (2 wheeled) Gait Pattern/deviations: Step-to pattern     General Gait Details: Close guard for safety. VCs safety, sequence. Pt preferred to pick walker up since she has a standard walker at home. Discussed pt considering RW use.   Stairs Stairs: Yes Stairs assistance: Min guard Stair Management: One rail Left;Sideways;Step to pattern Number of Stairs: 3 General stair comments: VCs safety, technique, sequence. Clsoe guard for safety.   Wheelchair  Mobility    Modified Rankin (Stroke Patients Only)       Balance Overall balance assessment: Mild deficits observed, not formally tested                                           Pertinent Vitals/Pain Pain Assessment: 0-10 Pain Score: 6  Pain Location: L knee Pain Descriptors / Indicators: Aching;Sore Pain Intervention(s): Monitored during session;Repositioned    Home Living Family/patient expects to be discharged to:: Private residence Living Arrangements: Children Available Help at Discharge: Family Type of Home: House Home Access: Stairs to enter Entrance Stairs-Rails: Psychiatric nurse of Steps: 5-front Home Layout: One level Home Equipment: Environmental consultant - 2 wheels;Bedside commode;Shower seat      Prior Function Level of Independence: Independent               Hand Dominance        Extremity/Trunk Assessment   Upper Extremity Assessment Upper Extremity Assessment: Overall WFL for tasks assessed    Lower Extremity Assessment Lower Extremity Assessment: Generalized weakness    Cervical / Trunk Assessment Cervical / Trunk Assessment: Normal  Communication   Communication: No difficulties  Cognition Arousal/Alertness: Awake/alert Behavior During Therapy: WFL for tasks assessed/performed Overall Cognitive Status: Within Functional Limits for tasks assessed                                        General Comments      Exercises  Total Joint Exercises Ankle Circles/Pumps: AROM;Both;10 reps;Supine Quad Sets: AROM;Both;10 reps;Supine Hip ABduction/ADduction: AROM;Left;10 reps;Supine Straight Leg Raises: AROM;Left;10 reps;Supine Knee Flexion: AROM;Left;10 reps;Seated Goniometric ROM: ~10-80 degrees   Assessment/Plan    PT Assessment Patient needs continued PT services  PT Problem List Decreased strength;Decreased balance;Decreased range of motion;Decreased mobility;Decreased activity  tolerance;Pain;Decreased knowledge of use of DME       PT Treatment Interventions DME instruction;Gait training;Functional mobility training;Therapeutic activities;Balance training;Neuromuscular re-education;Therapeutic exercise;Patient/family education    PT Goals (Current goals can be found in the Care Plan section)  Acute Rehab PT Goals Patient Stated Goal: regain independence in preparation for trip to [place] PT Goal Formulation: With patient Time For Goal Achievement: 07/01/18 Potential to Achieve Goals: Good    Frequency 7X/week   Barriers to discharge        Co-evaluation               AM-PAC PT "6 Clicks" Mobility  Outcome Measure Help needed turning from your back to your side while in a flat bed without using bedrails?: A Little Help needed moving from lying on your back to sitting on the side of a flat bed without using bedrails?: A Little Help needed moving to and from a bed to a chair (including a wheelchair)?: A Little Help needed standing up from a chair using your arms (e.g., wheelchair or bedside chair)?: A Little Help needed to walk in hospital room?: A Little Help needed climbing 3-5 steps with a railing? : A Little 6 Click Score: 18    End of Session Equipment Utilized During Treatment: Gait belt Activity Tolerance: Patient tolerated treatment well Patient left: in bed;with call bell/phone within reach   PT Visit Diagnosis: Pain;Other abnormalities of gait and mobility (R26.89) Pain - Right/Left: Left Pain - part of body: Knee    Time: 5830-9407 PT Time Calculation (min) (ACUTE ONLY): 41 min   Charges:   PT Evaluation $PT Eval Low Complexity: 1 Low PT Treatments $Gait Training: 8-22 mins $Therapeutic Exercise: 8-22 mins          Weston Anna, PT Acute Rehabilitation Services Pager: [phone] Office: [phone]

## 2018-06-18 DIAGNOSIS — Z471 Aftercare following joint replacement surgery: Secondary | ICD-10-CM | POA: Diagnosis not present

## 2018-06-18 DIAGNOSIS — M1711 Unilateral primary osteoarthritis, right knee: Secondary | ICD-10-CM | POA: Diagnosis not present

## 2018-06-18 DIAGNOSIS — E039 Hypothyroidism, unspecified: Secondary | ICD-10-CM | POA: Diagnosis not present

## 2018-06-18 DIAGNOSIS — G4730 Sleep apnea, unspecified: Secondary | ICD-10-CM | POA: Diagnosis not present

## 2018-06-18 DIAGNOSIS — G2581 Restless legs syndrome: Secondary | ICD-10-CM | POA: Diagnosis not present

## 2018-06-18 DIAGNOSIS — Z96652 Presence of left artificial knee joint: Secondary | ICD-10-CM | POA: Diagnosis not present

## 2018-06-18 DIAGNOSIS — J45909 Unspecified asthma, uncomplicated: Secondary | ICD-10-CM | POA: Diagnosis not present

## 2018-06-18 DIAGNOSIS — D649 Anemia, unspecified: Secondary | ICD-10-CM | POA: Diagnosis not present

## 2018-06-18 DIAGNOSIS — I10 Essential (primary) hypertension: Secondary | ICD-10-CM | POA: Diagnosis not present

## 2018-06-18 NOTE — Op Note (Signed)
TOTAL KNEE REPLACEMENT OPERATIVE NOTE:  06/16/2018  2:27 PM  PATIENT:  Tonya Webster  58 y.o. female  PRE-OPERATIVE DIAGNOSIS:  LT. KNEE OSTEOARTHRITIS  POST-OPERATIVE DIAGNOSIS:  LT. KNEE OSTEOARTHRITIS  PROCEDURE:  Procedure(s): TOTAL KNEE ARTHROPLASTY  SURGEON:  Surgeon(s): Vickey Huger, MD  PHYSICIAN ASSISTANT: Carlyon Shadow, PA-C /  ANESTHESIA:   spinal  SPECIMEN: None  COUNTS:  Correct  TOURNIQUET:   Total Tourniquet Time Documented: Thigh (Left) - 37 minutes Total: Thigh (Left) - 37 minutes   DICTATION:  Indication for procedure:    The patient is a 58 y.o. female who has failed conservative treatment for LT. KNEE OSTEOARTHRITIS.  Informed consent was obtained prior to anesthesia. The risks versus benefits of the operation were explain and in a way the patient can, and did, understand.   On the implant demand matching protocol, this patient scored 8.  Therefore, this patient was not receive a polyethylene insert with vitamin E which is a high demand implant.  Description of procedure:     The patient was taken to the operating room and placed under anesthesia.  The patient was positioned in the usual fashion taking care that all body parts were adequately padded and/or protected.  A tourniquet was applied and the leg prepped and draped in the usual sterile fashion.  The extremity was exsanguinated with the esmarch and tourniquet inflated to 350 mmHg.  Pre-operative range of motion was normal.  The knee was in 8 degree of mild valgus.  A midline incision approximately 6-7 inches long was made with a #10 blade.  A new blade was used to make a parapatellar arthrotomy going 2-3 cm into the quadriceps tendon, over the patella, and alongside the medial aspect of the patellar tendon.  A synovectomy was then performed with the #10 blade and forceps. I then elevated the deep MCL off the medial tibial metaphysis subperiosteally around to the semimembranosus attachment.    I  everted the patella and used calipers to measure patellar thickness.  I used the reamer to ream down to appropriate thickness to recreate the native thickness.  I then removed excess bone with the rongeur and sagittal saw.  I used the appropriately sized template and drilled the three lug holes.  I then put the trial in place and measured the thickness with the calipers to ensure recreation of the native thickness.  The trial was then removed and the patella subluxed and the knee brought into flexion.  A homan retractor was place to retract and protect the patella and lateral structures.  A Z-retractor was place medially to protect the medial structures.  The extra-medullary alignment system was used to make cut the tibial articular surface perpendicular to the anamotic axis of the tibia and in 3 degrees of posterior slope.  The cut surface and alignment jig was removed.  I then used the intramedullary alignment guide to make a 3 valgus cut on the distal femur.  I then marked out the epicondylar axis on the distal femur.  The posterior condylar axis measured 3 degrees.  I then used the anterior referencing sizer and measured the femur to be a size 6.  The 4-In-1 cutting block was screwed into place in external rotation matching the posterior condylar angle, making our cuts perpendicular to the epicondylar axis.  Anterior, posterior and chamfer cuts were made with the sagittal saw.  The cutting block and cut pieces were removed.  A lamina spreader was placed in 90 degrees of flexion.  The ACL, PCL, menisci, and posterior condylar osteophytes were removed.  A 10 mm spacer blocked was found to offer good flexion and extension gap balance after moderate in degree releasing.   The scoop retractor was then placed and the femoral finishing block was pinned in place.  The small sagittal saw was used as well as the lug drill to finish the femur.  The block and cut surfaces were removed and the medullary canal hole  filled with autograft bone from the cut pieces.  The tibia was delivered forward in deep flexion and external rotation.  A size D tray was selected and pinned into place centered on the medial 1/3 of the tibial tubercle.  The reamer and keel was used to prepare the tibia through the tray.    I then trialed with the size 6 femur, size D tibia, a 10 mm insert and the 32 patella.  I had excellent flexion/extension gap balance, excellent patella tracking.  Flexion was full and beyond 120 degrees; extension was zero.  These components were chosen and the staff opened them to me on the back table while the knee was lavaged copiously and the cement mixed.  The soft tissue was infiltrated with 60cc of exparel 1.3% through a 21 gauge needle.  I cemented in the components and removed all excess cement.  The polyethylene tibial component was snapped into place and the knee placed in extension while cement was hardening.  The capsule was infilltrated with a 60cc exparel/marcaine/saline mixture.   Once the cement was hard, the tourniquet was let down.  Hemostasis was obtained.  The arthrotomy was closed using a #1 stratofix running suture.  The deep soft tissues were closed with #0 vicryls and the subcuticular layer closed with #2-0 vicryl.  The skin was reapproximated and closed with 3.0 Monocryl.  The wound was covered with steristrips, aquacel dressing, and a TED stocking.   The patient was then awakened, extubated, and taken to the recovery room in stable condition.  BLOOD LOSS:  818EX COMPLICATIONS:  None.  PLAN OF CARE: Admit for overnight observation  PATIENT DISPOSITION:  PACU - hemodynamically stable.   Delay start of Pharmacological VTE agent (>24hrs) due to surgical blood loss or risk of bleeding:  not applicable  Please fax a copy of this op note to my office at [phone] (please only include page 1 and 2 of the Case Information op note)

## 2018-06-19 DIAGNOSIS — M1711 Unilateral primary osteoarthritis, right knee: Secondary | ICD-10-CM | POA: Diagnosis not present

## 2018-06-19 DIAGNOSIS — J45909 Unspecified asthma, uncomplicated: Secondary | ICD-10-CM | POA: Diagnosis not present

## 2018-06-19 DIAGNOSIS — I10 Essential (primary) hypertension: Secondary | ICD-10-CM | POA: Diagnosis not present

## 2018-06-19 DIAGNOSIS — Z471 Aftercare following joint replacement surgery: Secondary | ICD-10-CM | POA: Diagnosis not present

## 2018-06-19 DIAGNOSIS — E039 Hypothyroidism, unspecified: Secondary | ICD-10-CM | POA: Diagnosis not present

## 2018-06-19 DIAGNOSIS — D649 Anemia, unspecified: Secondary | ICD-10-CM | POA: Diagnosis not present

## 2018-06-20 DIAGNOSIS — D649 Anemia, unspecified: Secondary | ICD-10-CM | POA: Diagnosis not present

## 2018-06-20 DIAGNOSIS — I10 Essential (primary) hypertension: Secondary | ICD-10-CM | POA: Diagnosis not present

## 2018-06-20 DIAGNOSIS — E039 Hypothyroidism, unspecified: Secondary | ICD-10-CM | POA: Diagnosis not present

## 2018-06-20 DIAGNOSIS — Z471 Aftercare following joint replacement surgery: Secondary | ICD-10-CM | POA: Diagnosis not present

## 2018-06-20 DIAGNOSIS — M1711 Unilateral primary osteoarthritis, right knee: Secondary | ICD-10-CM | POA: Diagnosis not present

## 2018-06-20 DIAGNOSIS — J45909 Unspecified asthma, uncomplicated: Secondary | ICD-10-CM | POA: Diagnosis not present

## 2018-06-23 DIAGNOSIS — E039 Hypothyroidism, unspecified: Secondary | ICD-10-CM | POA: Diagnosis not present

## 2018-06-23 DIAGNOSIS — I10 Essential (primary) hypertension: Secondary | ICD-10-CM | POA: Diagnosis not present

## 2018-06-23 DIAGNOSIS — J45909 Unspecified asthma, uncomplicated: Secondary | ICD-10-CM | POA: Diagnosis not present

## 2018-06-23 DIAGNOSIS — Z471 Aftercare following joint replacement surgery: Secondary | ICD-10-CM | POA: Diagnosis not present

## 2018-06-23 DIAGNOSIS — M1711 Unilateral primary osteoarthritis, right knee: Secondary | ICD-10-CM | POA: Diagnosis not present

## 2018-06-23 DIAGNOSIS — D649 Anemia, unspecified: Secondary | ICD-10-CM | POA: Diagnosis not present

## 2018-06-25 DIAGNOSIS — M1711 Unilateral primary osteoarthritis, right knee: Secondary | ICD-10-CM | POA: Diagnosis not present

## 2018-06-25 DIAGNOSIS — Z471 Aftercare following joint replacement surgery: Secondary | ICD-10-CM | POA: Diagnosis not present

## 2018-06-25 DIAGNOSIS — I10 Essential (primary) hypertension: Secondary | ICD-10-CM | POA: Diagnosis not present

## 2018-06-25 DIAGNOSIS — J45909 Unspecified asthma, uncomplicated: Secondary | ICD-10-CM | POA: Diagnosis not present

## 2018-06-25 DIAGNOSIS — D649 Anemia, unspecified: Secondary | ICD-10-CM | POA: Diagnosis not present

## 2018-06-25 DIAGNOSIS — E039 Hypothyroidism, unspecified: Secondary | ICD-10-CM | POA: Diagnosis not present

## 2018-06-26 DIAGNOSIS — Z96652 Presence of left artificial knee joint: Secondary | ICD-10-CM | POA: Diagnosis not present

## 2018-06-26 DIAGNOSIS — M25462 Effusion, left knee: Secondary | ICD-10-CM | POA: Diagnosis not present

## 2018-06-26 DIAGNOSIS — R29898 Other symptoms and signs involving the musculoskeletal system: Secondary | ICD-10-CM | POA: Diagnosis not present

## 2018-06-26 DIAGNOSIS — M25562 Pain in left knee: Secondary | ICD-10-CM | POA: Diagnosis not present

## 2018-06-26 DIAGNOSIS — R262 Difficulty in walking, not elsewhere classified: Secondary | ICD-10-CM | POA: Diagnosis not present

## 2018-06-26 DIAGNOSIS — Z7409 Other reduced mobility: Secondary | ICD-10-CM | POA: Diagnosis not present

## 2018-06-26 DIAGNOSIS — M1712 Unilateral primary osteoarthritis, left knee: Secondary | ICD-10-CM | POA: Diagnosis not present

## 2018-07-02 DIAGNOSIS — M25562 Pain in left knee: Secondary | ICD-10-CM | POA: Diagnosis not present

## 2018-07-02 DIAGNOSIS — Z96652 Presence of left artificial knee joint: Secondary | ICD-10-CM | POA: Diagnosis not present

## 2018-07-02 DIAGNOSIS — Z7409 Other reduced mobility: Secondary | ICD-10-CM | POA: Diagnosis not present

## 2018-07-02 DIAGNOSIS — R262 Difficulty in walking, not elsewhere classified: Secondary | ICD-10-CM | POA: Diagnosis not present

## 2018-07-02 DIAGNOSIS — M1712 Unilateral primary osteoarthritis, left knee: Secondary | ICD-10-CM | POA: Diagnosis not present

## 2018-07-02 DIAGNOSIS — R29898 Other symptoms and signs involving the musculoskeletal system: Secondary | ICD-10-CM | POA: Diagnosis not present

## 2018-07-04 DIAGNOSIS — R262 Difficulty in walking, not elsewhere classified: Secondary | ICD-10-CM | POA: Diagnosis not present

## 2018-07-04 DIAGNOSIS — R29898 Other symptoms and signs involving the musculoskeletal system: Secondary | ICD-10-CM | POA: Diagnosis not present

## 2018-07-04 DIAGNOSIS — Z96652 Presence of left artificial knee joint: Secondary | ICD-10-CM | POA: Diagnosis not present

## 2018-07-04 DIAGNOSIS — M1712 Unilateral primary osteoarthritis, left knee: Secondary | ICD-10-CM | POA: Diagnosis not present

## 2018-07-04 DIAGNOSIS — M25562 Pain in left knee: Secondary | ICD-10-CM | POA: Diagnosis not present

## 2018-07-04 DIAGNOSIS — Z7409 Other reduced mobility: Secondary | ICD-10-CM | POA: Diagnosis not present

## 2018-07-07 DIAGNOSIS — M25562 Pain in left knee: Secondary | ICD-10-CM | POA: Diagnosis not present

## 2018-07-07 DIAGNOSIS — R262 Difficulty in walking, not elsewhere classified: Secondary | ICD-10-CM | POA: Diagnosis not present

## 2018-07-07 DIAGNOSIS — R29898 Other symptoms and signs involving the musculoskeletal system: Secondary | ICD-10-CM | POA: Diagnosis not present

## 2018-07-07 DIAGNOSIS — M1712 Unilateral primary osteoarthritis, left knee: Secondary | ICD-10-CM | POA: Diagnosis not present

## 2018-07-07 DIAGNOSIS — Z7409 Other reduced mobility: Secondary | ICD-10-CM | POA: Diagnosis not present

## 2018-07-07 DIAGNOSIS — Z96652 Presence of left artificial knee joint: Secondary | ICD-10-CM | POA: Diagnosis not present

## 2018-07-11 DIAGNOSIS — R29898 Other symptoms and signs involving the musculoskeletal system: Secondary | ICD-10-CM | POA: Diagnosis not present

## 2018-07-11 DIAGNOSIS — Z96652 Presence of left artificial knee joint: Secondary | ICD-10-CM | POA: Diagnosis not present

## 2018-07-11 DIAGNOSIS — M1712 Unilateral primary osteoarthritis, left knee: Secondary | ICD-10-CM | POA: Diagnosis not present

## 2018-07-11 DIAGNOSIS — R262 Difficulty in walking, not elsewhere classified: Secondary | ICD-10-CM | POA: Diagnosis not present

## 2018-07-11 DIAGNOSIS — M25562 Pain in left knee: Secondary | ICD-10-CM | POA: Diagnosis not present

## 2018-07-11 DIAGNOSIS — Z7409 Other reduced mobility: Secondary | ICD-10-CM | POA: Diagnosis not present

## 2018-07-14 DIAGNOSIS — M25562 Pain in left knee: Secondary | ICD-10-CM | POA: Diagnosis not present

## 2018-07-14 DIAGNOSIS — Z7409 Other reduced mobility: Secondary | ICD-10-CM | POA: Diagnosis not present

## 2018-07-14 DIAGNOSIS — M1712 Unilateral primary osteoarthritis, left knee: Secondary | ICD-10-CM | POA: Diagnosis not present

## 2018-07-14 DIAGNOSIS — R29898 Other symptoms and signs involving the musculoskeletal system: Secondary | ICD-10-CM | POA: Diagnosis not present

## 2018-07-14 DIAGNOSIS — R262 Difficulty in walking, not elsewhere classified: Secondary | ICD-10-CM | POA: Diagnosis not present

## 2018-07-14 DIAGNOSIS — Z96652 Presence of left artificial knee joint: Secondary | ICD-10-CM | POA: Diagnosis not present

## 2018-07-21 DIAGNOSIS — Z96652 Presence of left artificial knee joint: Secondary | ICD-10-CM | POA: Diagnosis not present

## 2018-07-21 DIAGNOSIS — R262 Difficulty in walking, not elsewhere classified: Secondary | ICD-10-CM | POA: Diagnosis not present

## 2018-07-21 DIAGNOSIS — Z7409 Other reduced mobility: Secondary | ICD-10-CM | POA: Diagnosis not present

## 2018-07-21 DIAGNOSIS — R29898 Other symptoms and signs involving the musculoskeletal system: Secondary | ICD-10-CM | POA: Diagnosis not present

## 2018-07-21 DIAGNOSIS — M1712 Unilateral primary osteoarthritis, left knee: Secondary | ICD-10-CM | POA: Diagnosis not present

## 2018-07-21 DIAGNOSIS — M25562 Pain in left knee: Secondary | ICD-10-CM | POA: Diagnosis not present

## 2018-07-25 DIAGNOSIS — M1712 Unilateral primary osteoarthritis, left knee: Secondary | ICD-10-CM | POA: Diagnosis not present

## 2018-07-25 DIAGNOSIS — R262 Difficulty in walking, not elsewhere classified: Secondary | ICD-10-CM | POA: Diagnosis not present

## 2018-07-25 DIAGNOSIS — Z96652 Presence of left artificial knee joint: Secondary | ICD-10-CM | POA: Diagnosis not present

## 2018-07-25 DIAGNOSIS — Z7409 Other reduced mobility: Secondary | ICD-10-CM | POA: Diagnosis not present

## 2018-07-25 DIAGNOSIS — R29898 Other symptoms and signs involving the musculoskeletal system: Secondary | ICD-10-CM | POA: Diagnosis not present

## 2018-07-25 DIAGNOSIS — M25562 Pain in left knee: Secondary | ICD-10-CM | POA: Diagnosis not present

## 2018-07-31 DIAGNOSIS — R21 Rash and other nonspecific skin eruption: Secondary | ICD-10-CM | POA: Diagnosis not present

## 2018-07-31 DIAGNOSIS — R05 Cough: Secondary | ICD-10-CM | POA: Diagnosis not present

## 2018-07-31 DIAGNOSIS — I10 Essential (primary) hypertension: Secondary | ICD-10-CM | POA: Diagnosis not present

## 2018-10-09 DIAGNOSIS — E039 Hypothyroidism, unspecified: Secondary | ICD-10-CM | POA: Diagnosis not present

## 2018-10-09 DIAGNOSIS — J309 Allergic rhinitis, unspecified: Secondary | ICD-10-CM | POA: Diagnosis not present

## 2018-10-09 DIAGNOSIS — F322 Major depressive disorder, single episode, severe without psychotic features: Secondary | ICD-10-CM | POA: Diagnosis not present

## 2018-10-09 DIAGNOSIS — J45909 Unspecified asthma, uncomplicated: Secondary | ICD-10-CM | POA: Diagnosis not present

## 2018-10-09 DIAGNOSIS — I10 Essential (primary) hypertension: Secondary | ICD-10-CM | POA: Diagnosis not present

## 2018-10-09 DIAGNOSIS — E559 Vitamin D deficiency, unspecified: Secondary | ICD-10-CM | POA: Diagnosis not present

## 2018-10-09 DIAGNOSIS — R21 Rash and other nonspecific skin eruption: Secondary | ICD-10-CM | POA: Diagnosis not present

## 2018-10-09 DIAGNOSIS — N3281 Overactive bladder: Secondary | ICD-10-CM | POA: Diagnosis not present

## 2018-10-10 DIAGNOSIS — E039 Hypothyroidism, unspecified: Secondary | ICD-10-CM | POA: Diagnosis not present

## 2018-10-10 DIAGNOSIS — E559 Vitamin D deficiency, unspecified: Secondary | ICD-10-CM | POA: Diagnosis not present

## 2018-10-10 DIAGNOSIS — I10 Essential (primary) hypertension: Secondary | ICD-10-CM | POA: Diagnosis not present

## 2018-11-18 DIAGNOSIS — M25561 Pain in right knee: Secondary | ICD-10-CM | POA: Diagnosis not present

## 2018-11-18 DIAGNOSIS — G8929 Other chronic pain: Secondary | ICD-10-CM | POA: Diagnosis not present

## 2018-12-03 ENCOUNTER — Other Ambulatory Visit: Payer: Self-pay | Admitting: Orthopedic Surgery

## 2018-12-03 NOTE — Patient Instructions (Addendum)
Tonya Webster  12/03/2018   Your procedure is scheduled on: Monday 12/08/2018   Report to [facility] Main  Entrance              Report to Short Stay at  [place] [facility] TEST ON 12-08-18 @10 :15 AM, THIS TEST MUST BE DONE BEFORE SURGERY, COME TO [place].             ONCE YOUR COVID TEST IS COMPLETED, PLEASE BEGIN THE QUARANTINE INSTRUCTIONS AS OUTLINED IN YOUR HANDOUT.   Call this number if you have problems the morning of surgery [phone]    Remember: NO SOLID FOOD AFTER MIDNIGHT THE NIGHT PRIOR TO SURGERY. NOTHING BY MOUTH EXCEPT CLEAR LIQUIDS UNTIL 3 HOURS PRIOR TO [place] SURGERY. PLEASE FINISH ENSURE DRINK PER SURGEON ORDER 3 HOURS PRIOR TO SCHEDULED SURGERY TIME WHICH NEEDS TO BE COMPLETED AT 5:00 AM.   CLEAR LIQUID DIET   Foods Allowed                                                                     Foods Excluded  Coffee and tea, regular and decaf                             liquids that you cannot  Plain Jell-O in any flavor                                             see through such as: Fruit ices (not with fruit pulp)                                     milk, soups, orange juice  Iced Popsicles                                    All solid food Carbonated beverages, regular and diet                                    Cranberry, grape and apple juices Sports drinks like Gatorade Lightly seasoned clear broth or consume(fat free) Sugar, honey syrup  Sample Menu Breakfast                                Lunch                                     Supper Cranberry juice  Beef broth                            Chicken broth Jell-O                                     Grape juice                           Apple juice Coffee or tea                        Jell-O                                      Popsicle                                                 Coffee or tea                        Coffee or tea  _____________________________________________________________________                 Take these medicines the morning of surgery with A SIP OF WATER: Levothyroxine (Synthroid), Buspirone (Buspar), and  Metoprolol succinate (Toprol-XL), use Symbicort  and Albuterol inhalers if needed and bring inhalers with you to hospital   [place], NO CHEWING GUM CANDY OR MINTS.                                You may not have any metal on your body including hair pins and              piercings     Do not wear jewelry, make-up, lotions, powders or perfumes, deodorant              Do not wear nail polish.  Do not shave  48 hours prior to surgery.                Do not bring valuables to the hospital. [place].  Contacts, dentures or bridgework may not be worn into surgery.                   Please read over the following fact sheets you were given: _____________________________________________________________________             [facility] - Preparing for Surgery Before surgery, you can play an important role.  Because skin is not sterile, your skin needs to be as free of germs as possible.  You can reduce the number of germs on your skin by washing with CHG (chlorahexidine gluconate) soap before surgery.  CHG is an antiseptic cleaner which kills germs and bonds with the skin to continue killing germs even after washing. Please DO NOT use if you have an allergy to CHG or antibacterial soaps.  If your skin becomes reddened/irritated  stop using the CHG and inform your nurse when you arrive at Short Stay. Do not shave (including legs and underarms) for at least 48 hours prior to the first CHG shower.  You may shave your face/neck. Please follow these instructions carefully:  1.  Shower with CHG Soap the night before surgery and the  morning of  Surgery.  2.  If you choose to wash your hair, wash your hair first as usual with your  normal  shampoo.  3.  After you shampoo, rinse your hair and body thoroughly to remove the  shampoo.                           4.  Use CHG as you would any other liquid soap.  You can apply chg directly  to the skin and wash                       Gently with a scrungie or clean washcloth.  5.  Apply the CHG Soap to your body ONLY FROM THE NECK DOWN.   Do not use on face/ open                           Wound or open sores. Avoid contact with eyes, ears mouth and genitals (private parts).                       Wash face,  Genitals (private parts) with your normal soap.             6.  Wash thoroughly, paying special attention to the area where your surgery  will be performed.  7.  Thoroughly rinse your body with warm water from the neck down.  8.  DO NOT shower/wash with your normal soap after using and rinsing off  the CHG Soap.                9.  Pat yourself dry with a clean towel.            10.  Wear clean pajamas.            11.  Place clean sheets on your bed the night of your first shower and do not  sleep with pets. Day of Surgery : Do not apply any lotions/deodorants the morning of surgery.  Please wear clean clothes to the hospital/surgery center.  FAILURE TO FOLLOW THESE INSTRUCTIONS MAY RESULT IN THE CANCELLATION OF YOUR SURGERY PATIENT SIGNATURE_________________________________  NURSE SIGNATURE__________________________________  ________________________________________________________________________

## 2018-12-04 ENCOUNTER — Other Ambulatory Visit (HOSPITAL_COMMUNITY)
Admission: RE | Admit: 2018-12-04 | Discharge: 2018-12-04 | Disposition: A | Discharge status: Home / Self Care | Payer: Medicare Other | Source: Ambulatory Visit | Attending: Orthopedic Surgery | Admitting: Orthopedic Surgery

## 2018-12-04 ENCOUNTER — Encounter (HOSPITAL_COMMUNITY): Payer: Self-pay

## 2018-12-04 ENCOUNTER — Encounter (HOSPITAL_COMMUNITY)
Admission: RE | Admit: 2018-12-04 | Discharge: 2018-12-04 | Disposition: A | Discharge status: Home / Self Care | Payer: Medicare Other | Source: Ambulatory Visit | Attending: Orthopedic Surgery | Admitting: Orthopedic Surgery

## 2018-12-04 ENCOUNTER — Other Ambulatory Visit: Payer: Self-pay

## 2018-12-04 DIAGNOSIS — Z1159 Encounter for screening for other viral diseases: Secondary | ICD-10-CM | POA: Diagnosis not present

## 2018-12-04 DIAGNOSIS — Z01818 Encounter for other preprocedural examination: Secondary | ICD-10-CM | POA: Diagnosis not present

## 2018-12-04 LAB — CBC WITH DIFFERENTIAL/PLATELET
Abs Immature Granulocytes: 0.01 10*3/uL (ref 0.00–0.07)
Basophils Absolute: 0 10*3/uL (ref 0.0–0.1)
Basophils Relative: 0 %
Eosinophils Absolute: 0.1 10*3/uL (ref 0.0–0.5)
Eosinophils Relative: 2 %
HCT: 37.4 % (ref 36.0–46.0)
Hemoglobin: 12.2 g/dL (ref 12.0–15.0)
Immature Granulocytes: 0 %
Lymphocytes Relative: 44 %
Lymphs Abs: 2.4 10*3/uL (ref 0.7–4.0)
MCH: 31.4 pg (ref 26.0–34.0)
MCHC: 32.6 g/dL (ref 30.0–36.0)
MCV: 96.4 fL (ref 80.0–100.0)
Monocytes Absolute: 0.5 10*3/uL (ref 0.1–1.0)
Monocytes Relative: 8 %
Neutro Abs: 2.5 10*3/uL (ref 1.7–7.7)
Neutrophils Relative %: 46 %
Platelets: 307 10*3/uL (ref 150–400)
RBC: 3.88 MIL/uL (ref 3.87–5.11)
RDW: 13.2 % (ref 11.5–15.5)
WBC: 5.5 10*3/uL (ref 4.0–10.5)
nRBC: 0 % (ref 0.0–0.2)

## 2018-12-04 LAB — SURGICAL PCR SCREEN
MRSA, PCR: NEGATIVE
Staphylococcus aureus: NEGATIVE

## 2018-12-04 LAB — COMPREHENSIVE METABOLIC PANEL
ALT: 18 U/L (ref 0–44)
AST: 22 U/L (ref 15–41)
Albumin: 4.2 g/dL (ref 3.5–5.0)
Alkaline Phosphatase: 96 U/L (ref 38–126)
Anion gap: 5 (ref 5–15)
BUN: 14 mg/dL (ref 6–20)
CO2: 27 mmol/L (ref 22–32)
Calcium: 9.1 mg/dL (ref 8.9–10.3)
Chloride: 106 mmol/L (ref 98–111)
Creatinine, Ser: 0.78 mg/dL (ref 0.44–1.00)
GFR calc Af Amer: >60 mL/min (ref >60)
GFR calc non Af Amer: >60 mL/min (ref >60)
Glucose, Bld: 94 mg/dL (ref 70–99)
Potassium: 3.9 mmol/L (ref 3.5–5.1)
Sodium: 138 mmol/L (ref 135–145)
Total Bilirubin: 0.4 mg/dL (ref 0.3–1.2)
Total Protein: 7.2 g/dL (ref 6.5–8.1)

## 2018-12-04 LAB — SARS CORONAVIRUS 2 (TAT 6-24 HRS): SARS Coronavirus 2: NEGATIVE

## 2018-12-07 MED ORDER — BUPIVACAINE LIPOSOME 1.3 % IJ SUSP
20.0000 mL | INTRAMUSCULAR | Status: DC
  Filled 2018-12-07: qty 20

## 2018-12-08 ENCOUNTER — Encounter (HOSPITAL_COMMUNITY): Payer: Self-pay

## 2018-12-08 ENCOUNTER — Ambulatory Visit (HOSPITAL_COMMUNITY): Payer: Medicare Other | Admitting: Physician Assistant

## 2018-12-08 ENCOUNTER — Ambulatory Visit (HOSPITAL_COMMUNITY)
Admission: RE | Admit: 2018-12-08 | Discharge: 2018-12-08 | Disposition: A | Discharge status: Home / Self Care | Payer: Medicare Other | Attending: Orthopedic Surgery | Admitting: Orthopedic Surgery

## 2018-12-08 ENCOUNTER — Encounter (HOSPITAL_COMMUNITY)
Admission: RE | Disposition: A | Discharge status: Home / Self Care | Payer: Self-pay | Source: Home / Self Care | Attending: Orthopedic Surgery

## 2018-12-08 ENCOUNTER — Ambulatory Visit (HOSPITAL_COMMUNITY): Payer: Medicare Other | Admitting: Certified Registered"

## 2018-12-08 DIAGNOSIS — Z8614 Personal history of Methicillin resistant Staphylococcus aureus infection: Secondary | ICD-10-CM | POA: Insufficient documentation

## 2018-12-08 DIAGNOSIS — G2581 Restless legs syndrome: Secondary | ICD-10-CM | POA: Diagnosis not present

## 2018-12-08 DIAGNOSIS — E039 Hypothyroidism, unspecified: Secondary | ICD-10-CM | POA: Insufficient documentation

## 2018-12-08 DIAGNOSIS — G4730 Sleep apnea, unspecified: Secondary | ICD-10-CM | POA: Insufficient documentation

## 2018-12-08 DIAGNOSIS — Z9884 Bariatric surgery status: Secondary | ICD-10-CM | POA: Diagnosis not present

## 2018-12-08 DIAGNOSIS — M797 Fibromyalgia: Secondary | ICD-10-CM | POA: Diagnosis not present

## 2018-12-08 DIAGNOSIS — J45909 Unspecified asthma, uncomplicated: Secondary | ICD-10-CM | POA: Insufficient documentation

## 2018-12-08 DIAGNOSIS — Z79899 Other long term (current) drug therapy: Secondary | ICD-10-CM | POA: Diagnosis not present

## 2018-12-08 DIAGNOSIS — Z9109 Other allergy status, other than to drugs and biological substances: Secondary | ICD-10-CM | POA: Insufficient documentation

## 2018-12-08 DIAGNOSIS — Z96652 Presence of left artificial knee joint: Secondary | ICD-10-CM | POA: Insufficient documentation

## 2018-12-08 DIAGNOSIS — Z91018 Allergy to other foods: Secondary | ICD-10-CM | POA: Insufficient documentation

## 2018-12-08 DIAGNOSIS — I10 Essential (primary) hypertension: Secondary | ICD-10-CM | POA: Diagnosis not present

## 2018-12-08 DIAGNOSIS — Z6841 Body Mass Index (BMI) 40.0 and over, adult: Secondary | ICD-10-CM | POA: Diagnosis not present

## 2018-12-08 DIAGNOSIS — G8918 Other acute postprocedural pain: Secondary | ICD-10-CM | POA: Diagnosis not present

## 2018-12-08 DIAGNOSIS — M1990 Unspecified osteoarthritis, unspecified site: Secondary | ICD-10-CM | POA: Diagnosis not present

## 2018-12-08 DIAGNOSIS — G709 Myoneural disorder, unspecified: Secondary | ICD-10-CM | POA: Insufficient documentation

## 2018-12-08 DIAGNOSIS — M1711 Unilateral primary osteoarthritis, right knee: Secondary | ICD-10-CM | POA: Diagnosis not present

## 2018-12-08 HISTORY — PX: TOTAL KNEE ARTHROPLASTY: SHX125

## 2018-12-08 SURGERY — ARTHROPLASTY, KNEE, TOTAL
Anesthesia: General | Site: Knee | Laterality: Right

## 2018-12-08 MED ORDER — DEXAMETHASONE SODIUM PHOSPHATE 10 MG/ML IJ SOLN
INTRAMUSCULAR | Status: DC | PRN
  Administered 2018-12-08: 8 mg via INTRAVENOUS

## 2018-12-08 MED ORDER — FENTANYL CITRATE (PF) 100 MCG/2ML IJ SOLN
INTRAMUSCULAR | Status: AC
  Filled 2018-12-08: qty 2

## 2018-12-08 MED ORDER — SUGAMMADEX SODIUM 500 MG/5ML IV SOLN
INTRAVENOUS | Status: DC | PRN
  Administered 2018-12-08: 300 mg via INTRAVENOUS

## 2018-12-08 MED ORDER — LIDOCAINE 2% (20 MG/ML) 5 ML SYRINGE
INTRAMUSCULAR | Status: DC | PRN
  Administered 2018-12-08: 60 mg via INTRAVENOUS
  Administered 2018-12-08: 40 mg via INTRAVENOUS

## 2018-12-08 MED ORDER — OXYCODONE HCL 5 MG/5ML PO SOLN: 5.0000 mg | Freq: Once | ORAL | Status: DC | PRN

## 2018-12-08 MED ORDER — FENTANYL CITRATE (PF) 250 MCG/5ML IJ SOLN
INTRAMUSCULAR | Status: AC
  Filled 2018-12-08: qty 5

## 2018-12-08 MED ORDER — OXYCODONE HCL 5 MG PO TABS: 5.0000 mg | ORAL_TABLET | Freq: Once | ORAL | Status: DC | PRN

## 2018-12-08 MED ORDER — MIDAZOLAM HCL 2 MG/2ML IJ SOLN
1.0000 mg | INTRAMUSCULAR | Status: DC
  Administered 2018-12-08: 08:00:00 2 mg via INTRAVENOUS
  Filled 2018-12-08: qty 2

## 2018-12-08 MED ORDER — SODIUM CHLORIDE 0.9 % IV SOLN
INTRAVENOUS | Status: AC | PRN
  Administered 2018-12-08: 1000 mL via INTRAMUSCULAR

## 2018-12-08 MED ORDER — SODIUM CHLORIDE 0.9 % IR SOLN
Status: DC | PRN
  Administered 2018-12-08: 1000 mL

## 2018-12-08 MED ORDER — WATER FOR IRRIGATION, STERILE IR SOLN
Status: DC | PRN
  Administered 2018-12-08 (×2): 1000 mL

## 2018-12-08 MED ORDER — POVIDONE-IODINE 10 % EX SWAB: 2.0000 "application " | Freq: Once | CUTANEOUS | Status: DC

## 2018-12-08 MED ORDER — FENTANYL CITRATE (PF) 250 MCG/5ML IJ SOLN
INTRAMUSCULAR | Status: DC | PRN
  Administered 2018-12-08: 25 ug via INTRAVENOUS
  Administered 2018-12-08: 100 ug via INTRAVENOUS
  Administered 2018-12-08 (×2): 50 ug via INTRAVENOUS
  Administered 2018-12-08: 25 ug via INTRAVENOUS

## 2018-12-08 MED ORDER — PROPOFOL 10 MG/ML IV BOLUS
INTRAVENOUS | Status: DC | PRN
  Administered 2018-12-08: 180 mg via INTRAVENOUS

## 2018-12-08 MED ORDER — SODIUM CHLORIDE 0.9% FLUSH
INTRAVENOUS | Status: DC | PRN
  Administered 2018-12-08: 20 mL

## 2018-12-08 MED ORDER — ONDANSETRON HCL 4 MG/2ML IJ SOLN
INTRAMUSCULAR | Status: DC | PRN
  Administered 2018-12-08: 4 mg via INTRAVENOUS

## 2018-12-08 MED ORDER — ROPIVACAINE HCL 5 MG/ML IJ SOLN
INTRAMUSCULAR | Status: DC | PRN
  Administered 2018-12-08: 20 mL via PERINEURAL

## 2018-12-08 MED ORDER — ONDANSETRON HCL 4 MG/2ML IJ SOLN: 4.0000 mg | Freq: Four times a day (QID) | INTRAMUSCULAR | Status: DC | PRN

## 2018-12-08 MED ORDER — DEXAMETHASONE SODIUM PHOSPHATE 10 MG/ML IJ SOLN: 8.0000 mg | Freq: Once | INTRAMUSCULAR | Status: DC

## 2018-12-08 MED ORDER — BUPIVACAINE LIPOSOME 1.3 % IJ SUSP
INTRAMUSCULAR | Status: DC | PRN
  Administered 2018-12-08: 20 mL

## 2018-12-08 MED ORDER — OXYCODONE HCL 5 MG PO TABS: 5.0000 mg | ORAL_TABLET | Freq: Four times a day (QID) | ORAL | 0 refills | Status: DC | PRN

## 2018-12-08 MED ORDER — ROCURONIUM BROMIDE 10 MG/ML (PF) SYRINGE
PREFILLED_SYRINGE | INTRAVENOUS | Status: DC | PRN
  Administered 2018-12-08: 30 mg via INTRAVENOUS

## 2018-12-08 MED ORDER — ASPIRIN EC 325 MG PO TBEC: 325.0000 mg | DELAYED_RELEASE_TABLET | Freq: Two times a day (BID) | ORAL | 0 refills | Status: AC

## 2018-12-08 MED ORDER — LABETALOL HCL 5 MG/ML IV SOLN
INTRAVENOUS | Status: DC | PRN
  Administered 2018-12-08 (×2): 2.5 mg via INTRAVENOUS

## 2018-12-08 MED ORDER — CELECOXIB 200 MG PO CAPS
400.0000 mg | ORAL_CAPSULE | Freq: Once | ORAL | Status: AC
  Administered 2018-12-08: 07:00:00 400 mg via ORAL
  Filled 2018-12-08: qty 2

## 2018-12-08 MED ORDER — SODIUM CHLORIDE (PF) 0.9 % IJ SOLN
INTRAMUSCULAR | Status: AC
  Filled 2018-12-08: qty 20

## 2018-12-08 MED ORDER — TRANEXAMIC ACID-NACL 1000-0.7 MG/100ML-% IV SOLN
1000.0000 mg | INTRAVENOUS | Status: AC
  Administered 2018-12-08: 1000 mg via INTRAVENOUS
  Filled 2018-12-08: qty 100

## 2018-12-08 MED ORDER — FENTANYL CITRATE (PF) 100 MCG/2ML IJ SOLN
50.0000 ug | INTRAMUSCULAR | Status: DC
  Administered 2018-12-08: 08:00:00 100 ug via INTRAVENOUS
  Filled 2018-12-08: qty 2

## 2018-12-08 MED ORDER — BUPIVACAINE-EPINEPHRINE (PF) 0.25% -1:200000 IJ SOLN
INTRAMUSCULAR | Status: AC
  Filled 2018-12-08: qty 30

## 2018-12-08 MED ORDER — SUCCINYLCHOLINE CHLORIDE 200 MG/10ML IV SOSY
PREFILLED_SYRINGE | INTRAVENOUS | Status: DC | PRN
  Administered 2018-12-08: 140 mg via INTRAVENOUS

## 2018-12-08 MED ORDER — CHLORHEXIDINE GLUCONATE 4 % EX LIQD: 60.0000 mL | Freq: Once | CUTANEOUS | Status: DC

## 2018-12-08 MED ORDER — TRAMADOL HCL 50 MG PO TABS: 50.0000 mg | ORAL_TABLET | Freq: Four times a day (QID) | ORAL | 0 refills | Status: AC | PRN

## 2018-12-08 MED ORDER — LACTATED RINGERS IV SOLN
INTRAVENOUS | Status: DC
  Administered 2018-12-08 (×2): via INTRAVENOUS

## 2018-12-08 MED ORDER — BUPIVACAINE-EPINEPHRINE 0.25% -1:200000 IJ SOLN
INTRAMUSCULAR | Status: DC | PRN
  Administered 2018-12-08: 30 mL

## 2018-12-08 MED ORDER — FENTANYL CITRATE (PF) 100 MCG/2ML IJ SOLN
25.0000 ug | INTRAMUSCULAR | Status: DC | PRN
  Administered 2018-12-08 (×3): 50 ug via INTRAVENOUS

## 2018-12-08 MED ORDER — SODIUM CHLORIDE 0.9 % IR SOLN
Status: DC | PRN
  Administered 2018-12-08 (×2): 1000 mL

## 2018-12-08 MED ORDER — ACETAMINOPHEN 500 MG PO TABS
1000.0000 mg | ORAL_TABLET | Freq: Once | ORAL | Status: AC
  Administered 2018-12-08: 07:00:00 1000 mg via ORAL
  Filled 2018-12-08: qty 2

## 2018-12-08 MED ORDER — PROPOFOL 10 MG/ML IV BOLUS
INTRAVENOUS | Status: AC
  Filled 2018-12-08: qty 60

## 2018-12-08 MED ORDER — GABAPENTIN 300 MG PO CAPS
300.0000 mg | ORAL_CAPSULE | Freq: Once | ORAL | Status: AC
  Administered 2018-12-08: 300 mg via ORAL
  Filled 2018-12-08: qty 1

## 2018-12-08 MED ORDER — METHOCARBAMOL 500 MG PO TABS: 500.0000 mg | ORAL_TABLET | Freq: Four times a day (QID) | ORAL | 0 refills | Status: AC | PRN

## 2018-12-08 MED ORDER — CEFAZOLIN SODIUM-DEXTROSE 2-4 GM/100ML-% IV SOLN
2.0000 g | INTRAVENOUS | Status: AC
  Administered 2018-12-08: 2 g via INTRAVENOUS
  Filled 2018-12-08: qty 100

## 2018-12-08 MED ORDER — MIDAZOLAM HCL 2 MG/2ML IJ SOLN
INTRAMUSCULAR | Status: AC
  Filled 2018-12-08: qty 2

## 2018-12-08 SURGICAL SUPPLY — 65 items
ARTISURF 11M PLY R 3-5CD KNEE (Knees) ×3 IMPLANT
BAG ZIPLOCK 12X15 (MISCELLANEOUS) ×3 IMPLANT
BANDAGE ACE 6X5 VEL STRL LF (GAUZE/BANDAGES/DRESSINGS) ×3 IMPLANT
BLADE SAGITTAL 13X1.27X60 (BLADE) ×2 IMPLANT
BLADE SAGITTAL 13X1.27X60MM (BLADE) ×1
BLADE SAW SGTL 83.5X18.5 (BLADE) ×3 IMPLANT
BLADE SURG 15 STRL LF DISP TIS (BLADE) ×1 IMPLANT
BLADE SURG 15 STRL SS (BLADE) ×2
BLADE SURG SZ10 CARB STEEL (BLADE) ×6 IMPLANT
BOWL SMART MIX CTS (DISPOSABLE) ×3 IMPLANT
CEMENT BONE SIMPLEX SPEEDSET (Cement) ×6 IMPLANT
CLOSURE STERI-STRIP 1/2X4 (GAUZE/BANDAGES/DRESSINGS) ×1
CLOSURE WOUND 1/2 X4 (GAUZE/BANDAGES/DRESSINGS) ×1
CLSR STERI-STRIP ANTIMIC 1/2X4 (GAUZE/BANDAGES/DRESSINGS) ×2 IMPLANT
COVER SURGICAL LIGHT HANDLE (MISCELLANEOUS) ×3 IMPLANT
COVER WAND RF STERILE (DRAPES) IMPLANT
CUFF TOURN SGL QUICK 34 (TOURNIQUET CUFF) ×2
CUFF TRNQT CYL 34X4.125X (TOURNIQUET CUFF) ×1 IMPLANT
DECANTER SPIKE VIAL GLASS SM (MISCELLANEOUS) ×6 IMPLANT
DRAPE INCISE IOBAN 66X45 STRL (DRAPES) ×6 IMPLANT
DRAPE U-SHAPE 47X51 STRL (DRAPES) ×3 IMPLANT
DRESSING AQUACEL AG SP 3.5X10 (GAUZE/BANDAGES/DRESSINGS) ×1 IMPLANT
DRILL PIN HEADLESS TROCAR 3X75 (PIN) ×3 IMPLANT
DRSG AQUACEL AG ADV 3.5X10 (GAUZE/BANDAGES/DRESSINGS) ×3 IMPLANT
DRSG AQUACEL AG SP 3.5X10 (GAUZE/BANDAGES/DRESSINGS) ×3
DURAPREP 26ML APPLICATOR (WOUND CARE) ×6 IMPLANT
ELECT REM PT RETURN 15FT ADLT (MISCELLANEOUS) ×3 IMPLANT
FEMUR  CMT CCR STD SZ5 R KNEE (Knees) ×2 IMPLANT
FEMUR CMT CCR STD SZ5 R KNEE (Knees) ×1 IMPLANT
FEMUR CMTD CCR STD SZ5 R KNEE (Knees) ×1 IMPLANT
GLOVE BIOGEL M STRL SZ7.5 (GLOVE) ×3 IMPLANT
GLOVE BIOGEL PI IND STRL 7.5 (GLOVE) ×1 IMPLANT
GLOVE BIOGEL PI IND STRL 8.5 (GLOVE) ×2 IMPLANT
GLOVE BIOGEL PI INDICATOR 7.5 (GLOVE) ×2
GLOVE BIOGEL PI INDICATOR 8.5 (GLOVE) ×4
GLOVE SURG ORTHO 8.0 STRL STRW (GLOVE) ×9 IMPLANT
GOWN STRL REUS W/ TWL XL LVL3 (GOWN DISPOSABLE) ×2 IMPLANT
GOWN STRL REUS W/TWL XL LVL3 (GOWN DISPOSABLE) ×4
HANDPIECE INTERPULSE COAX TIP (DISPOSABLE) ×2
HDLS TROCR DRIL PIN KNEE 75 (Miscellaneous) ×1 IMPLANT
HOLDER FOLEY CATH W/STRAP (MISCELLANEOUS) ×3 IMPLANT
HOOD PEEL AWAY FLYTE STAYCOOL (MISCELLANEOUS) ×9 IMPLANT
KIT TURNOVER KIT A (KITS) IMPLANT
MANIFOLD NEPTUNE II (INSTRUMENTS) ×3 IMPLANT
NEEDLE HYPO 22GX1.5 SAFETY (NEEDLE) ×3 IMPLANT
NS IRRIG 1000ML POUR BTL (IV SOLUTION) ×3 IMPLANT
PACK TOTAL KNEE CUSTOM (KITS) ×3 IMPLANT
PIN DRILL HDLS TROCAR 75 4PK (Miscellaneous) ×1 IMPLANT
PROTECTOR NERVE ULNAR (MISCELLANEOUS) ×3 IMPLANT
SET HNDPC FAN SPRY TIP SCT (DISPOSABLE) ×1 IMPLANT
STEM POLY PAT PLY 32M KNEE (Knees) ×3 IMPLANT
STEM TIBIA 5 DEG SZ C R KNEE (Knees) ×1 IMPLANT
STRIP CLOSURE SKIN 1/2X4 (GAUZE/BANDAGES/DRESSINGS) ×2 IMPLANT
SUT BONE WAX W31G (SUTURE) ×3 IMPLANT
SUT MNCRL AB 3-0 PS2 18 (SUTURE) ×3 IMPLANT
SUT STRATAFIX 0 PDS 27 VIOLET (SUTURE) ×3
SUT STRATAFIX PDS+ 0 24IN (SUTURE) ×3 IMPLANT
SUT VIC AB 1 CT1 36 (SUTURE) ×3 IMPLANT
SUTURE STRATFX 0 PDS 27 VIOLET (SUTURE) ×1 IMPLANT
SYR CONTROL 10ML LL (SYRINGE) ×6 IMPLANT
TIBIA STEM 5 DEG SZ C R KNEE (Knees) ×3 IMPLANT
TRAY FOLEY MTR SLVR 16FR STAT (SET/KITS/TRAYS/PACK) ×3 IMPLANT
WATER STERILE IRR 1000ML POUR (IV SOLUTION) ×6 IMPLANT
WRAP KNEE MAXI GEL POST OP (GAUZE/BANDAGES/DRESSINGS) ×3 IMPLANT
YANKAUER SUCT BULB TIP 10FT TU (MISCELLANEOUS) ×3 IMPLANT

## 2018-12-08 NOTE — Progress Notes (Signed)
Assisted Dr. Hodierne with right, ultrasound guided, adductor canal block. Side rails up, monitors on throughout procedure. See vital signs in flow sheet. Tolerated Procedure well.  

## 2018-12-08 NOTE — Anesthesia Procedure Notes (Signed)
Date/Time: 12/08/2018 10:04 AM Performed by: Cynda Familia, CRNA Oxygen Delivery Method: Simple face mask Dental Injury: Teeth and Oropharynx as per pre-operative assessment

## 2018-12-08 NOTE — Anesthesia Procedure Notes (Signed)
Anesthesia [facility] Block: Adductor canal block   Pre-Anesthetic Checklist: ,, timeout performed, Correct Patient, Correct Site, Correct Laterality, Correct Procedure, Correct Position, site marked, Risks and benefits discussed,  Surgical consent,  Pre-op evaluation,  At surgeon's request and post-op pain management  Laterality: Right  Prep: chloraprep       Needles:  Injection technique: Single-shot  Needle Type: Echogenic Needle     Needle Length: 9cm  Needle Gauge: 21     Additional Needles:   Narrative:  Start time: 12/08/2018 7:56 AM End time: 12/08/2018 8:02 AM Injection made incrementally with aspirations every 5 mL.  Performed by: Personally  Anesthesiologist: Albertha Ghee, MD  Additional Notes: Pt tolerated the procedure well.

## 2018-12-08 NOTE — H&P (Addendum)
Kween Bacorn Hadlock MRN:  [id number] DOB/SEX:  1960-09-07/female  CHIEF COMPLAINT:  Painful right Knee  HISTORY: Patient is a 58 y.o. female presented with a history of pain in the right knee. Onset of symptoms was gradual starting a few years ago with gradually worsening course since that time. Patient has been treated conservatively with over-the-counter NSAIDs and activity modification. Patient currently rates pain in the knee at 10 out of 10 with activity. There is pain at night.  PAST MEDICAL HISTORY: Patient Active Problem List   Diagnosis Date Noted  . S/P total knee replacement 06/16/2018  . Unilateral primary osteoarthritis, left knee 03/08/2017  . Unilateral primary osteoarthritis, right knee 03/08/2017   Past Medical History:  Diagnosis Date  . Anemia   . Asthma   . Cellulitis   . Environmental allergies   . Fibromyalgia   . Hypertension   . Hypothyroidism   . MRSA infection   . Pneumonia   . Restless leg   . Sleep apnea   . Thyroid disease    Past Surgical History:  Procedure Laterality Date  . BACK SURGERY    . CESAREAN SECTION    . GASTRIC BYPASS    . JOINT REPLACEMENT     Left total knee arthroplasty Dr. Ronnie Derby 06-16-17  . TOTAL KNEE ARTHROPLASTY Left 06/16/2018   Procedure: TOTAL KNEE ARTHROPLASTY;  Surgeon: Vickey Huger, MD;  Location: [facility];  Service: Orthopedics;  Laterality: Left;     MEDICATIONS:   Medications Prior to Admission  Medication Sig Dispense Refill Last Dose  . acetaminophen (TYLENOL) 650 MG CR tablet Take 1,300 mg by mouth 2 (two) times daily.     Marland Kitchen albuterol (VENTOLIN HFA) 108 (90 Base) MCG/ACT inhaler Inhale 2 puffs into the lungs every 6 (six) hours as needed for wheezing or shortness of breath.     Marland Kitchen aspirin EC 81 MG tablet Take 81 mg by mouth daily.     . budesonide-formoterol (SYMBICORT) 160-4.5 MCG/ACT inhaler Inhale 2 puffs into the lungs 2 (two) times daily as needed (for respiratory issues.).     Marland Kitchen busPIRone (BUSPAR) 5 MG tablet Take  5 mg by mouth 2 (two) times daily.  4   . citalopram (CELEXA) 40 MG tablet Take 40 mg by mouth every evening.      . fluticasone (FLONASE) 50 MCG/ACT nasal spray Place 2 sprays into both nostrils daily as needed for allergies or rhinitis.     Marland Kitchen gabapentin (NEURONTIN) 600 MG tablet Take 600 mg by mouth 4 (four) times daily as needed (pain).      . hydrochlorothiazide (HYDRODIURIL) 25 MG tablet Take 25 mg by mouth daily.     Marland Kitchen levothyroxine (SYNTHROID, LEVOTHROID) 125 MCG tablet Take 125 mcg by mouth daily before breakfast.  1   . metoprolol succinate (TOPROL-XL) 25 MG 24 hr tablet Take 25 mg by mouth daily.     . montelukast (SINGULAIR) 10 MG tablet Take 10 mg by mouth at bedtime.     . Multiple Vitamins-Minerals (BARIATRIC MULTIVITAMINS/IRON PO) Take 2 capsules by mouth daily.     Marland Kitchen oxybutynin (DITROPAN) 5 MG tablet Take 5 mg by mouth 2 (two) times daily.     . trandolapril-verapamil (TARKA) 2-240 MG tablet Take 1 tablet by mouth every evening.      . Vitamin D, Ergocalciferol, (DRISDOL) 1.25 MG (50000 UT) CAPS capsule Take 50,000 Units by mouth every Friday.  3   . aspirin EC 325 MG EC tablet Take 1 tablet (  325 mg total) by mouth 2 (two) times daily. (Patient not taking: Reported on 12/03/2018) 30 tablet 0 Not Taking at Unknown time  . methocarbamol (ROBAXIN) 500 MG tablet Take 1-2 tablets (500-1,000 mg total) by mouth every 6 (six) hours as needed for muscle spasms. (Patient not taking: Reported on 12/03/2018) 60 tablet 0 Not Taking at Unknown time  . oxyCODONE (OXY IR/ROXICODONE) 5 MG immediate release tablet Take 1-2 tablets (5-10 mg total) by mouth every 6 (six) hours as needed for moderate pain (pain score 4-6). (Patient not taking: Reported on 12/03/2018) 50 tablet 0 Not Taking at Unknown time  . traMADol (ULTRAM) 50 MG tablet Take 1-2 tablets three times daily as needed for pain. (Patient not taking: Reported on 12/03/2018) 30 tablet 0 Not Taking at Unknown time    ALLERGIES:   Allergies   Allergen Reactions  . Soy Allergy Anaphylaxis    Itching, and swelling of throat   . Dust Mite Extract Other (See Comments)    Dust ragweed, mold, pollen-environmental allergies     REVIEW OF SYSTEMS:  A comprehensive review of systems was negative except for: Musculoskeletal: positive for arthralgias and bone pain   FAMILY HISTORY:   Family History  Problem Relation Age of Onset  . Breast cancer Neg Hx     SOCIAL HISTORY:   Social History   Tobacco Use  . Smoking status: Never Smoker  . Smokeless tobacco: Never Used  Substance Use Topics  . Alcohol use: No     EXAMINATION:  Vital signs in last 24 hours: Temp:  [97.7 F (36.5 C)] 97.7 F (36.5 C) (06/29 0548) Pulse Rate:  [65] 65 (06/29 0548) Resp:  [16] 16 (06/29 0548) BP: (140)/(74) 140/74 (06/29 0548) SpO2:  [98 %] 98 % (06/29 0548) Weight:  [108.9 kg] 108.9 kg (06/29 0500)  BP 140/74   Pulse 65   Temp 97.7 F (36.5 C) (Oral)   Resp 16   Ht 5\' 4"  (1.626 m)   Wt 108.9 kg   LMP 07/27/2013   SpO2 98%   BMI 41.20 kg/m   General Appearance:    Alert, cooperative, no distress, appears stated age  Head:    Normocephalic, without obvious abnormality, atraumatic  Eyes:    PERRL, conjunctiva/corneas clear, EOM's intact, fundi    benign, both eyes  Ears:    Normal TM's and external ear canals, both ears  Nose:   Nares normal, septum midline, mucosa normal, no drainage    or sinus tenderness  Throat:   Lips, mucosa, and tongue normal; teeth and gums normal  Neck:   Supple, symmetrical, trachea midline, no adenopathy;    thyroid:  no enlargement/tenderness/nodules; no carotid   bruit or JVD  Back:     Symmetric, no curvature, ROM normal, no CVA tenderness  Lungs:     Clear to auscultation bilaterally, respirations unlabored  Chest Wall:    No tenderness or deformity   Heart:    Regular rate and rhythm, S1 and S2 normal, no murmur, rub   or gallop  Breast Exam:    No tenderness, masses, or nipple abnormality   Abdomen:     Soft, non-tender, bowel sounds active all four quadrants,    no masses, no organomegaly  Genitalia:    Normal female without lesion, discharge or tenderness  Rectal:    Normal tone, no masses or tenderness;   guaiac negative stool  Extremities:   Extremities normal, atraumatic, no cyanosis or edema  Pulses:  2+ and symmetric all extremities  Skin:   Skin color, texture, turgor normal, no rashes or lesions  Lymph nodes:   Cervical, supraclavicular, and axillary nodes normal  Neurologic:   CNII-XII intact, normal strength, sensation and reflexes    throughout    Musculoskeletal:  ROM 0-120, Ligaments intact,  Imaging Review Plain radiographs demonstrate severe degenerative joint disease of the right knee. The overall alignment is neutral. The bone quality appears to be good for age and reported activity level.  Assessment/Plan: Primary osteoarthritis, right knee   The patient history, physical examination and imaging studies are consistent with advanced degenerative joint disease of the right knee. The patient has failed conservative treatment.  The clearance notes were reviewed.  After discussion with the patient it was felt that Total Knee Replacement was indicated. The procedure,  risks, and benefits of total knee arthroplasty were presented and reviewed. The risks including but not limited to aseptic loosening, infection, blood clots, vascular injury, stiffness, patella tracking problems complications among others were discussed. The patient acknowledged the explanation, agreed to proceed with the plan.  Preoperative templating of the joint replacement has been completed, documented, and submitted to the Operating Room personnel in order to optimize intra-operative equipment management.    Patient's anticipated LOS is less than 2 midnights, meeting these requirements: - Lives within 1 hour of care - Has a competent adult at home to recover with post-op recover - NO  history of  - Chronic pain requiring opiods  - Diabetes  - Coronary Artery Disease  - Heart failure  - Heart attack  - Stroke  - DVT/VTE  - Cardiac arrhythmia  - Respiratory Failure/COPD  - Renal failure  - Anemia  - Advanced Liver disease       Donia Ast 12/08/2018, 6:22 AM

## 2018-12-08 NOTE — Evaluation (Signed)
Physical Therapy Evaluation Patient Details Name: Tonya Webster MRN: [id number] DOB: 1961-02-06 Today's Date: 12/08/2018   History of Present Illness  58 yo female s/p R TKR on 12/08/18. PMH includes L TKR 06/2018, cellulitis, fibromyalgia, HTN, RLS, OSA, gastric bypass.  Clinical Impression   Pt presents with mild-moderate R knee pain, decreased R knee ROM, and increased time and effort to perform mobility tasks. Pt to benefit from acute PT to address deficits. Pt ambulated 100 ft with RW with min guard assist, min verbal cuing for form and safety. Pt very knowledgeable about TKR procedure and post-op mobility, given L TKR in 06/2018. Pt performed transfers, ambulation, stair navigation, and TKR exercises proficiently, handout for exercises and stair navigation administered, reviewed, demonstrated, and practiced. Pt educated on ankle pumps (20/hour) to perform this afternoon/evening to increase circulation, to pt's tolerance and limited by pain. Pt to start OPPT in ~1 week, pt to progress mobility in OP setting. Pt to d/c today, safe to d/c from PT standpoint.     Follow Up Recommendations Follow surgeon's recommendation for DC plan and follow-up therapies;Supervision for mobility/OOB(OPPT - Pt states scheduled July 6th)    Equipment Recommendations  None recommended by PT    Recommendations for Other Services       Precautions / Restrictions Precautions Precautions: Fall Restrictions Weight Bearing Restrictions: No Other Position/Activity Restrictions: WBAT      Mobility  Bed Mobility Overal bed mobility: (NT)             General bed mobility comments: Pt up in chair upon PT arrival, not in room with bed in PACU so bed mobility not practiced.  Transfers Overall transfer level: Needs assistance Equipment used: Rolling walker (2 wheeled) Transfers: Sit to/from Stand Sit to Stand: Supervision         General transfer comment: supervision for safety, pt with correct  placement of UEs on armrests of recliner when rising.  Ambulation/Gait Ambulation/Gait assistance: Supervision;Min guard;+2 safety/equipment Gait Distance (Feet): 100 Feet Assistive device: Rolling walker (2 wheeled) Gait Pattern/deviations: Step-to pattern;Decreased step length - right;Decreased step length - left;Trunk flexed Gait velocity: decr   General Gait Details: Min guard to supervision for safety. Pt slow and steady with gait, no evidence of RLE weakness or instability noted. Min verbal cuing for upright posture and placement in RW during ambulation. Pt with proper use of RW and turning with RW WNL.  Stairs Stairs: Yes Stairs assistance: Supervision;Min guard Stair Management: No rails;Step to pattern;Forwards;With walker Number of Stairs: 3(3+2) General stair comments: min guard to supervision for safety, verbal cuing to reinforce sequencing (up with LLE, down with RLE first), stabilizing RW against step before mobilizing, and where pt's son needs to stand with ascending/descending and how to steady RW. Pt able to state sequencing to PT, performs stair navigation well and cautiously.  Wheelchair Mobility    Modified Rankin (Stroke Patients Only)       Balance Overall balance assessment: Mild deficits observed, not formally tested                                           Pertinent Vitals/Pain Pain Assessment: 0-10 Pain Score: 4  Pain Location: R knee Pain Descriptors / Indicators: Sore Pain Intervention(s): Limited activity within patient's tolerance;Repositioned;Monitored during session;Premedicated before session    Home Living Family/patient expects to be discharged to:: Private residence Living Arrangements: Children(son)  Available Help at Discharge: Family;Available 24 hours/day(son works from home and is available whenever needed per pt) Type of Home: House Home Access: Stairs to enter Entrance Stairs-Rails: Right Entrance Stairs-Number  of Steps: 5-front Home Layout: One level Home Equipment: Environmental consultant - 2 wheels;Bedside commode;Shower seat;Cane - single point      Prior Function Level of Independence: Independent               Hand Dominance   Dominant Hand: Right    Extremity/Trunk Assessment   Upper Extremity Assessment Upper Extremity Assessment: Overall WFL for tasks assessed    Lower Extremity Assessment Lower Extremity Assessment: Overall WFL for tasks assessed;RLE deficits/detail RLE Deficits / Details: suspected post-surgical weakness; able to perform ankle pumps, quad set, heel slide to 75*, SLR without lift assist or quad lag. Pt very steady in WB RLE Sensation: WNL    Cervical / Trunk Assessment Cervical / Trunk Assessment: Normal  Communication   Communication: No difficulties  Cognition Arousal/Alertness: Awake/alert Behavior During Therapy: WFL for tasks assessed/performed Overall Cognitive Status: Within Functional Limits for tasks assessed                                        General Comments      Exercises Total Joint Exercises Ankle Circles/Pumps: AROM;Both;5 reps;Seated Quad Sets: AROM;Right;5 reps;Seated Short Arc Quad: AROM;Right;5 reps;Seated Heel Slides: AAROM;Right;5 reps;Seated Hip ABduction/ADduction: AROM;Right;5 reps;Seated Straight Leg Raises: AROM;Right;5 reps;Seated Goniometric ROM: knee aarom 5-75*, limited by pain and stiffness   Assessment/Plan    PT Assessment All further PT needs can be met in the next venue of care  PT Problem List Decreased mobility;Decreased activity tolerance;Pain       PT Treatment Interventions (N/A - D/C today)    PT Goals (Current goals can be found in the Care Plan section)  Acute Rehab PT Goals Patient Stated Goal: go home today PT Goal Formulation: With patient Time For Goal Achievement: 12/08/18 Potential to Achieve Goals: Good    Frequency (N/A - D/C today)   Barriers to discharge         Co-evaluation               AM-PAC PT "6 Clicks" Mobility  Outcome Measure Help needed turning from your back to your side while in a flat bed without using bedrails?: None Help needed moving from lying on your back to sitting on the side of a flat bed without using bedrails?: None Help needed moving to and from a bed to a chair (including a wheelchair)?: A Little Help needed standing up from a chair using your arms (e.g., wheelchair or bedside chair)?: None Help needed to walk in hospital room?: A Little Help needed climbing 3-5 steps with a railing? : A Little 6 Click Score: 21    End of Session Equipment Utilized During Treatment: Gait belt Activity Tolerance: Patient tolerated treatment well Patient left: in chair;with call bell/phone within reach Nurse Communication: Mobility status PT Visit Diagnosis: Other abnormalities of gait and mobility (R26.89)    Time: 3825-0539 PT Time Calculation (min) (ACUTE ONLY): 34 min   Charges:   PT Evaluation $PT Eval Low Complexity: 1 Low PT Treatments $Gait Training: 8-22 mins      Julien Girt, PT Acute Rehabilitation Services Pager [phone]  Office [phone]   Dechelle Attaway D Amaan Meyer 12/08/2018, 1:35 PM

## 2018-12-08 NOTE — Anesthesia Procedure Notes (Signed)
Procedure Name: Intubation Date/Time: 12/08/2018 8:20 AM Performed by: Cynda Familia, CRNA Pre-anesthesia Checklist: Patient identified, Emergency Drugs available, Suction available and Patient being monitored Patient Re-evaluated:Patient Re-evaluated prior to induction Oxygen Delivery Method: Circle System Utilized Preoxygenation: Pre-oxygenation with 100% oxygen Induction Type: IV induction, Rapid sequence and Cricoid Pressure applied Ventilation: Mask ventilation without difficulty Laryngoscope Size: Miller and 2 Grade View: Grade I Tube type: Oral Number of attempts: 1 Airway Equipment and Method: Stylet Placement Confirmation: ETT inserted through vocal cords under direct vision,  positive ETCO2 and breath sounds checked- equal and bilateral Secured at: 21 cm Tube secured with: Tape Dental Injury: Teeth and Oropharynx as per pre-operative assessment  Comments: Smooth Iv induction Hodierne-- intubation AM CRNA atraumatic-- teeth and mouth as preop-- bilat BS Hodierne

## 2018-12-08 NOTE — Op Note (Signed)
TOTAL KNEE REPLACEMENT OPERATIVE NOTE:  12/08/2018  11:16 AM  PATIENT:  Tonya Webster  58 y.o. female  PRE-OPERATIVE DIAGNOSIS:  OSTEOARTHRITIS OF RIGHT KNEE  POST-OPERATIVE DIAGNOSIS:  OSTEOARTHRITIS OF RIGHT KNEE  PROCEDURE:  Procedure(s): TOTAL KNEE ARTHROPLASTY  SURGEON:  Surgeon(s): Vickey Huger, MD  PHYSICIAN ASSISTANT: Nehemiah Massed, PA-C  ANESTHESIA:   general  SPECIMEN: None  COUNTS:  Correct  TOURNIQUET:   Total Tourniquet Time Documented: Thigh (Right) - 42 minutes Total: Thigh (Right) - 42 minutes   DICTATION:  Indication for procedure:    The patient is a 58 y.o. female who has failed conservative treatment for OSTEOARTHRITIS OF RIGHT KNEE.  Informed consent was obtained prior to anesthesia. The risks versus benefits of the operation were explain and in a way the patient can, and did, understand.   Description of procedure:     The patient was taken to the operating room and placed under anesthesia.  The patient was positioned in the usual fashion taking care that all body parts were adequately padded and/or protected.  A tourniquet was applied and the leg prepped and draped in the usual sterile fashion.  The extremity was exsanguinated with the esmarch and tourniquet inflated to 350 mmHg.  Pre-operative range of motion was normal.   A midline incision approximately 6-7 inches long was made with a #10 blade.  A new blade was used to make a parapatellar arthrotomy going 2-3 cm into the quadriceps tendon, over the patella, and alongside the medial aspect of the patellar tendon.  A synovectomy was then performed with the #10 blade and forceps. I then elevated the deep MCL off the medial tibial metaphysis subperiosteally around to the semimembranosus attachment.    I everted the patella and used calipers to measure patellar thickness.  I used the reamer to ream down to appropriate thickness to recreate the native thickness.  I then removed excess bone with the  rongeur and sagittal saw.  I used the appropriately sized template and drilled the three lug holes.  I then put the trial in place and measured the thickness with the calipers to ensure recreation of the native thickness.  The trial was then removed and the patella subluxed and the knee brought into flexion.  A homan retractor was place to retract and protect the patella and lateral structures.  A Z-retractor was place medially to protect the medial structures.  The extra-medullary alignment system was used to make cut the tibial articular surface perpendicular to the anamotic axis of the tibia and in 3 degrees of posterior slope.  The cut surface and alignment jig was removed.  I then used the intramedullary alignment guide to make a  valgus cut on the distal femur.  I then marked out the epicondylar axis on the distal femur.   I then used the anterior referencing sizer and measured the femur to be a size 5.  The 4-In-1 cutting block was screwed into place in external rotation matching the posterior condylar angle, making our cuts perpendicular to the epicondylar axis.  Anterior, posterior and chamfer cuts were made with the sagittal saw.  The cutting block and cut pieces were removed.  A lamina spreader was placed in 90 degrees of flexion.  The ACL, PCL, menisci, and posterior condylar osteophytes were removed.  A 11 mm spacer blocked was found to offer good flexion and extension gap balance after minimal in degree releasing.   The scoop retractor was then placed and the femoral finishing  block was pinned in place.  The small sagittal saw was used as well as the lug drill to finish the femur.  The block and cut surfaces were removed and the medullary canal hole filled with autograft bone from the cut pieces.  The tibia was delivered forward in deep flexion and external rotation.  A size C tray was selected and pinned into place centered on the medial 1/3 of the tibial tubercle.  The reamer and keel was  used to prepare the tibia through the tray.    I then trialed with the size 5 femur, size C tibia, a 11 mm insert and the 32 patella.  I had excellent flexion/extension gap balance, excellent patella tracking.  Flexion was full and beyond 120 degrees; extension was zero.  These components were chosen and the staff opened them to me on the back table while the knee was lavaged copiously and the cement mixed.  The soft tissue was infiltrated with 60cc of exparel 1.3% through a 21 gauge needle.  I cemented in the components and removed all excess cement.  The polyethylene tibial component was snapped into place and the knee placed in extension while cement was hardening.  The capsule was infilltrated with a 60cc exparel/marcaine/saline mixture.   Once the cement was hard, the tourniquet was let down.  Hemostasis was obtained.  The arthrotomy was closed using a #1 stratofix running suture.  The deep soft tissues were closed with #0 vicryls and the subcuticular layer closed with #2-0 vicryl.  The skin was reapproximated and closed with 3.0 Monocryl.  The wound was covered with steristrips, aquacel dressing, and a TED stocking.   The patient was then awakened, extubated, and taken to the recovery room in stable condition.  BLOOD LOSS:  096GE COMPLICATIONS:  None.  PLAN OF CARE: Discharge to home after PACU  PATIENT DISPOSITION:  PACU - hemodynamically stable.    Please fax a copy of this op note to my office at [phone] (please only include page 1 and 2 of the Case Information op note)

## 2018-12-08 NOTE — Transfer of Care (Signed)
Immediate Anesthesia Transfer of Care Note  Patient: Tonya Webster  Procedure(s) Performed: TOTAL KNEE ARTHROPLASTY (Right Knee)  Patient Location: PACU  Anesthesia Type:General  Level of Consciousness: awake and alert   Airway & Oxygen Therapy: Patient Spontanous Breathing and Patient connected to face mask oxygen  Post-op Assessment: Report given to RN and Post -op Vital signs reviewed and stable  Post vital signs: Reviewed and stable  Last Vitals:  Vitals Value Taken Time  BP 174/90 12/08/18 1018  Temp    Pulse 61 12/08/18 1020  Resp 11 12/08/18 1020  SpO2 100 % 12/08/18 1020  Vitals shown include unvalidated device data.  Last Pain:  Vitals:   12/08/18 0730  TempSrc:   PainSc: 0-No pain         Complications: No apparent anesthesia complications

## 2018-12-08 NOTE — Anesthesia Preprocedure Evaluation (Signed)
Anesthesia Evaluation  Patient identified by MRN, date of birth, ID band Patient awake    Reviewed: Allergy & Precautions, H&P , NPO status , Patient's Chart, lab work & pertinent test results  Airway Mallampati: II   Neck ROM: full    Dental   Pulmonary asthma , sleep apnea ,    breath sounds clear to auscultation       Cardiovascular hypertension,  Rhythm:regular Rate:Normal     Neuro/Psych  Neuromuscular disease    GI/Hepatic   Endo/Other  Hypothyroidism Morbid obesity  Renal/GU      Musculoskeletal  (+) Arthritis , Fibromyalgia -  Abdominal   Peds  Hematology   Anesthesia Other Findings   Reproductive/Obstetrics                             Anesthesia Physical Anesthesia Plan  ASA: II  Anesthesia Plan: General   Post-op Pain Management:  Regional for Post-op pain   Induction: Intravenous  PONV Risk Score and Plan: 3 and Ondansetron, Dexamethasone, Propofol infusion, Midazolam and Treatment may vary due to age or medical condition  Airway Management Planned: Oral ETT  Additional Equipment:   Intra-op Plan:   Post-operative Plan: Extubation in OR  Informed Consent: I have reviewed the patients History and Physical, chart, labs and discussed the procedure including the risks, benefits and alternatives for the proposed anesthesia with the patient or authorized representative who has indicated his/her understanding and acceptance.       Plan Discussed with: CRNA, Anesthesiologist and Surgeon  Anesthesia Plan Comments:         Anesthesia Quick Evaluation

## 2018-12-09 ENCOUNTER — Encounter (HOSPITAL_COMMUNITY): Payer: Self-pay | Admitting: Orthopedic Surgery

## 2018-12-09 NOTE — Discharge Summary (Signed)
SPORTS MEDICINE & JOINT REPLACEMENT   Lara Mulch, MD   Carlyon Shadow, PA-C [address], [place], [place]  [address]                             [phone]  PATIENT ID: Tonya Webster        MRN:  [id number]          DOB/AGE: [birth year] / 58 y.o.    DISCHARGE SUMMARY  ADMISSION DATE:    12/08/2018 DISCHARGE DATE:   12/08/2018  ADMISSION DIAGNOSIS: OSTEOARTHRITIS OF RIGHT KNEE    DISCHARGE DIAGNOSIS:  OSTEOARTHRITIS OF RIGHT KNEE    ADDITIONAL DIAGNOSIS: Active Problems:   * No active hospital problems. *  Past Medical History:  Diagnosis Date  . Anemia   . Asthma   . Cellulitis   . Environmental allergies   . Fibromyalgia   . Hypertension   . Hypothyroidism   . MRSA infection   . Pneumonia   . Restless leg   . Sleep apnea   . Thyroid disease     PROCEDURE: Procedure(s): TOTAL KNEE ARTHROPLASTY on 12/08/2018  CONSULTS:    HISTORY:  See H&P in chart  HOSPITAL COURSE:  Tonya Webster is a 58 y.o. admitted on 12/08/2018 and found to have a diagnosis of OSTEOARTHRITIS OF RIGHT KNEE.  After appropriate laboratory studies were obtained  they were taken to the operating room on 12/08/2018 and underwent Procedure(s): TOTAL KNEE ARTHROPLASTY.   They were given perioperative antibiotics:  Anti-infectives (From admission, onward)   Start     Dose/Rate Route Frequency Ordered Stop   12/08/18 0600  ceFAZolin (ANCEF) IVPB 2g/100 mL premix     2 g 200 mL/hr over 30 Minutes Intravenous On call to O.R. 12/08/18 9892 12/08/18 1194    .  Patient given tranexamic acid IV or topical and exparel intra-operatively.  Tolerated the procedure well.    POD# 1: Vital signs were stable.  Patient denied Chest pain, shortness of breath, or calf pain.  Patient was started on Aspirin twice daily at 8am.  Consults to PT, OT, and care management were made.  The patient was weight bearing as tolerated.  CPM was placed on the operative leg 0-90 degrees for 6-8 hours a day. When out of  the CPM, patient was placed in the foam block to achieve full extension. Incentive spirometry was taught.  Dressing was changed.       POD #2, Continued  PT for ambulation and exercise program.  IV saline locked.  O2 discontinued.    The remainder of the hospital course was dedicated to ambulation and strengthening.   The patient was discharged on same day in  Good condition.  Blood products given:none  DIAGNOSTIC STUDIES: Recent vital signs:  Patient Vitals for the past 24 hrs:  BP Temp Pulse Resp SpO2  12/08/18 1230 (!) 156/87 - 70 - 95 %  12/08/18 1200 (!) 149/79 - 67 16 95 %  12/08/18 1130 (!) 152/72 98.2 F (36.8 C) 65 10 95 %  12/08/18 1115 (!) 146/68 - 62 17 96 %  12/08/18 1100 130/80 - 66 (!) 8 100 %  12/08/18 1045 (!) 186/96 - 70 (!) 21 100 %  12/08/18 1030 (!) 183/86 - (!) 59 (!) 9 100 %  12/08/18 1018 (!) 174/90 97.7 F (36.5 C) 65 10 100 %  12/08/18 0805 - - (!) 56 10 100 %  12/08/18  0800 128/68 - (!) 55 12 100 %  12/08/18 0755 132/86 - (!) 50 15 100 %       Recent laboratory studies: Recent Labs    12/04/18 0954  WBC 5.5  HGB 12.2  HCT 37.4  PLT 307   Recent Labs    12/04/18 0954  NA 138  K 3.9  CL 106  CO2 27  BUN 14  CREATININE 0.78  GLUCOSE 94  CALCIUM 9.1   Lab Results  Component Value Date   INR 1.1 03/27/2008     Recent Radiographic Studies :  No results found.  DISCHARGE INSTRUCTIONS: Discharge Instructions    Call MD / Call 911   Complete by: As directed    If you experience chest pain or shortness of breath, CALL 911 and be transported to the hospital emergency room.  If you develope a fever above 101 F, pus (white drainage) or increased drainage or redness at the wound, or calf pain, call your surgeon's office.   Constipation Prevention   Complete by: As directed    Drink plenty of fluids.  Prune juice may be helpful.  You may use a stool softener, such as Colace (over the counter) 100 mg twice a day.  Use MiraLax (over the  counter) for constipation as needed.   Diet - low sodium heart healthy   Complete by: As directed    Discharge instructions   Complete by: As directed    INSTRUCTIONS AFTER JOINT REPLACEMENT   Remove items at home which could result in a fall. This includes throw rugs or furniture in walking pathways ICE to the affected joint every three hours while awake for 30 minutes at a time, for at least the first 3-5 days, and then as needed for pain and swelling.  Continue to use ice for pain and swelling. You may notice swelling that will progress down to the foot and ankle.  This is normal after surgery.  Elevate your leg when you are not up walking on it.   Continue to use the breathing machine you got in the hospital (incentive spirometer) which will help keep your temperature down.  It is common for your temperature to cycle up and down following surgery, especially at night when you are not up moving around and exerting yourself.  The breathing machine keeps your lungs expanded and your temperature down.   DIET:  As you were doing prior to hospitalization, we recommend a well-balanced diet.  DRESSING / WOUND CARE / SHOWERING  Keep the surgical dressing until follow up.  The dressing is water proof, so you can shower without any extra covering.  IF THE DRESSING FALLS OFF or the wound gets wet inside, change the dressing with sterile gauze.  Please use good hand washing techniques before changing the dressing.  Do not use any lotions or creams on the incision until instructed by your surgeon.    ACTIVITY  Increase activity slowly as tolerated, but follow the weight bearing instructions below.   No driving for 6 weeks or until further direction given by your physician.  You cannot drive while taking narcotics.  No lifting or carrying greater than 10 lbs. until further directed by your surgeon. Avoid periods of inactivity such as sitting longer than an hour when not asleep. This helps prevent blood  clots.  You may return to work once you are authorized by your doctor.     WEIGHT BEARING   Weight bearing as tolerated with assist  device (walker, cane, etc) as directed, use it as long as suggested by your surgeon or therapist, typically at least 4-6 weeks.   EXERCISES  Results after joint replacement surgery are often greatly improved when you follow the exercise, range of motion and muscle strengthening exercises prescribed by your doctor. Safety measures are also important to protect the joint from further injury. Any time any of these exercises cause you to have increased pain or swelling, decrease what you are doing until you are comfortable again and then slowly increase them. If you have problems or questions, call your caregiver or physical therapist for advice.   Rehabilitation is important following a joint replacement. After just a few days of immobilization, the muscles of the leg can become weakened and shrink (atrophy).  These exercises are designed to build up the tone and strength of the thigh and leg muscles and to improve motion. Often times heat used for twenty to thirty minutes before working out will loosen up your tissues and help with improving the range of motion but do not use heat for the first two weeks following surgery (sometimes heat can increase post-operative swelling).   These exercises can be done on a training (exercise) mat, on the floor, on a table or on a bed. Use whatever works the best and is most comfortable for you.    Use music or television while you are exercising so that the exercises are a pleasant break in your day. This will make your life better with the exercises acting as a break in your routine that you can look forward to.   Perform all exercises about fifteen times, three times per day or as directed.  You should exercise both the operative leg and the other leg as well.   Exercises include:   Quad Sets - Tighten up the muscle on the front  of the thigh (Quad) and hold for 5-10 seconds.   Straight Leg Raises - With your knee straight (if you were given a brace, keep it on), lift the leg to 60 degrees, hold for 3 seconds, and slowly lower the leg.  Perform this exercise against resistance later as your leg gets stronger.  Leg Slides: Lying on your back, slowly slide your foot toward your buttocks, bending your knee up off the floor (only go as far as is comfortable). Then slowly slide your foot back down until your leg is flat on the floor again.  Angel Wings: Lying on your back spread your legs to the side as far apart as you can without causing discomfort.  Hamstring Strength:  Lying on your back, push your heel against the floor with your leg straight by tightening up the muscles of your buttocks.  Repeat, but this time bend your knee to a comfortable angle, and push your heel against the floor.  You may put a pillow under the heel to make it more comfortable if necessary.   A rehabilitation program following joint replacement surgery can speed recovery and prevent re-injury in the future due to weakened muscles. Contact your doctor or a physical therapist for more information on knee rehabilitation.    CONSTIPATION  Constipation is defined medically as fewer than three stools per week and severe constipation as less than one stool per week.  Even if you have a regular bowel pattern at home, your normal regimen is likely to be disrupted due to multiple reasons following surgery.  Combination of anesthesia, postoperative narcotics, change in appetite and fluid  intake all can affect your bowels.   YOU MUST use at least one of the following options; they are listed in order of increasing strength to get the job done.  They are all available over the counter, and you may need to use some, POSSIBLY even all of these options:    Drink plenty of fluids (prune juice may be helpful) and high fiber foods Colace 100 mg by mouth twice a day   Senokot for constipation as directed and as needed Dulcolax (bisacodyl), take with full glass of water  Miralax (polyethylene glycol) once or twice a day as needed.  If you have tried all these things and are unable to have a bowel movement in the first 3-4 days after surgery call either your surgeon or your primary doctor.    If you experience loose stools or diarrhea, hold the medications until you stool forms back up.  If your symptoms do not get better within 1 week or if they get worse, check with your doctor.  If you experience "the worst abdominal pain ever" or develop nausea or vomiting, please contact the office immediately for further recommendations for treatment.   ITCHING:  If you experience itching with your medications, try taking only a single pain pill, or even half a pain pill at a time.  You can also use Benadryl over the counter for itching or also to help with sleep.   TED HOSE STOCKINGS:  Use stockings on both legs until for at least 2 weeks or as directed by physician office. They may be removed at night for sleeping.  MEDICATIONS:  See your medication summary on the "After Visit Summary" that nursing will review with you.  You may have some home medications which will be placed on hold until you complete the course of blood thinner medication.  It is important for you to complete the blood thinner medication as prescribed.  PRECAUTIONS:  If you experience chest pain or shortness of breath - call 911 immediately for transfer to the hospital emergency department.   If you develop a fever greater that 101 F, purulent drainage from wound, increased redness or drainage from wound, foul odor from the wound/dressing, or calf pain - CONTACT YOUR SURGEON.                                                   FOLLOW-UP APPOINTMENTS:  If you do not already have a post-op appointment, please call the office for an appointment to be seen by your surgeon.  Guidelines for how soon to be seen  are listed in your "After Visit Summary", but are typically between 1-4 weeks after surgery.  OTHER INSTRUCTIONS:   Knee Replacement:  Do not place pillow under knee, focus on keeping the knee straight while resting. CPM instructions: 0-90 degrees, 2 hours in the morning, 2 hours in the afternoon, and 2 hours in the evening. Place foam block, curve side up under heel at all times except when in CPM or when walking.  DO NOT modify, tear, cut, or change the foam block in any way.  MAKE SURE YOU:  Understand these instructions.  Get help right away if you are not doing well or get worse.    Thank you for letting us be a part of your medical care team.  It is a privilege  we respect greatly.  We hope these instructions will help you stay on track for a fast and full recovery!   Increase activity slowly as tolerated   Complete by: As directed       DISCHARGE MEDICATIONS:   Allergies as of 12/08/2018      Reactions   Soy Allergy Anaphylaxis   Itching, and swelling of throat    Dust Mite Extract Other (See Comments)   Dust ragweed, mold, pollen-environmental allergies       Medication List    STOP taking these medications   acetaminophen 650 MG CR tablet Commonly known as: TYLENOL     TAKE these medications   albuterol 108 (90 Base) MCG/ACT inhaler Commonly known as: VENTOLIN HFA Inhale 2 puffs into the lungs every 6 (six) hours as needed for wheezing or shortness of breath.   aspirin EC 325 MG tablet Take 1 tablet (325 mg total) by mouth 2 (two) times a day for 15 days. What changed:   when to take this  Another medication with the same name was removed. Continue taking this medication, and follow the directions you see here.   BARIATRIC MULTIVITAMINS/IRON PO Take 2 capsules by mouth daily.   budesonide-formoterol 160-4.5 MCG/ACT inhaler Commonly known as: SYMBICORT Inhale 2 puffs into the lungs 2 (two) times daily as needed (for respiratory issues.).   busPIRone 5 MG  tablet Commonly known as: BUSPAR Take 5 mg by mouth 2 (two) times daily.   citalopram 40 MG tablet Commonly known as: CELEXA Take 40 mg by mouth every evening.   fluticasone 50 MCG/ACT nasal spray Commonly known as: FLONASE Place 2 sprays into both nostrils daily as needed for allergies or rhinitis.   gabapentin 600 MG tablet Commonly known as: NEURONTIN Take 600 mg by mouth 4 (four) times daily as needed (pain).   hydrochlorothiazide 25 MG tablet Commonly known as: HYDRODIURIL Take 25 mg by mouth daily.   levothyroxine 125 MCG tablet Commonly known as: SYNTHROID Take 125 mcg by mouth daily before breakfast.   methocarbamol 500 MG tablet Commonly known as: ROBAXIN Take 1-2 tablets (500-1,000 mg total) by mouth every 6 (six) hours as needed for muscle spasms.   metoprolol succinate 25 MG 24 hr tablet Commonly known as: TOPROL-XL Take 25 mg by mouth daily.   montelukast 10 MG tablet Commonly known as: SINGULAIR Take 10 mg by mouth at bedtime.   oxybutynin 5 MG tablet Commonly known as: DITROPAN Take 5 mg by mouth 2 (two) times daily.   oxyCODONE 5 MG immediate release tablet Commonly known as: Oxy IR/ROXICODONE Take 1-2 tablets (5-10 mg total) by mouth every 6 (six) hours as needed for severe pain. What changed: reasons to take this   traMADol 50 MG tablet Commonly known as: ULTRAM Take 1-2 tablets (50-100 mg total) by mouth every 6 (six) hours as needed. What changed:   how much to take  how to take this  when to take this  reasons to take this  additional instructions   trandolapril-verapamil 2-240 MG tablet Commonly known as: TARKA Take 1 tablet by mouth every evening.   Vitamin D (Ergocalciferol) 1.25 MG (50000 UT) Caps capsule Commonly known as: DRISDOL Take 50,000 Units by mouth every Friday.       FOLLOW UP VISIT:    DISPOSITION: HOME VS. SNF  CONDITION:  Good   Donia Ast 12/09/2018, 6:53 AM

## 2018-12-11 NOTE — Anesthesia Postprocedure Evaluation (Signed)
Anesthesia Post Note  Patient: Tonya Webster  Procedure(s) Performed: TOTAL KNEE ARTHROPLASTY (Right Knee)     Patient location during evaluation: PACU Anesthesia Type: General Level of consciousness: awake and alert Pain management: pain level controlled Vital Signs Assessment: post-procedure vital signs reviewed and stable Respiratory status: spontaneous breathing, nonlabored ventilation, respiratory function stable and patient connected to nasal cannula oxygen Cardiovascular status: blood pressure returned to baseline and stable Postop Assessment: no apparent nausea or vomiting Anesthetic complications: no    Last Vitals:  Vitals:   12/08/18 1200 12/08/18 1230  BP: (!) 149/79 (!) 156/87  Pulse: 67 70  Resp: 16   Temp:    SpO2: 95% 95%    Last Pain:  Vitals:   12/08/18 1230  TempSrc:   PainSc: 0-No pain                 Nia Nathaniel S

## 2018-12-15 DIAGNOSIS — R262 Difficulty in walking, not elsewhere classified: Secondary | ICD-10-CM | POA: Diagnosis not present

## 2018-12-15 DIAGNOSIS — M25561 Pain in right knee: Secondary | ICD-10-CM | POA: Diagnosis not present

## 2018-12-15 DIAGNOSIS — Z789 Other specified health status: Secondary | ICD-10-CM | POA: Diagnosis not present

## 2018-12-15 DIAGNOSIS — Z419 Encounter for procedure for purposes other than remedying health state, unspecified: Secondary | ICD-10-CM | POA: Diagnosis not present

## 2018-12-15 DIAGNOSIS — Z7409 Other reduced mobility: Secondary | ICD-10-CM | POA: Diagnosis not present

## 2018-12-15 DIAGNOSIS — R29898 Other symptoms and signs involving the musculoskeletal system: Secondary | ICD-10-CM | POA: Diagnosis not present

## 2018-12-15 DIAGNOSIS — Z96651 Presence of right artificial knee joint: Secondary | ICD-10-CM | POA: Diagnosis not present

## 2018-12-17 DIAGNOSIS — M25561 Pain in right knee: Secondary | ICD-10-CM | POA: Diagnosis not present

## 2018-12-17 DIAGNOSIS — R29898 Other symptoms and signs involving the musculoskeletal system: Secondary | ICD-10-CM | POA: Diagnosis not present

## 2018-12-17 DIAGNOSIS — Z96651 Presence of right artificial knee joint: Secondary | ICD-10-CM | POA: Diagnosis not present

## 2018-12-17 DIAGNOSIS — Z7409 Other reduced mobility: Secondary | ICD-10-CM | POA: Diagnosis not present

## 2018-12-17 DIAGNOSIS — Z789 Other specified health status: Secondary | ICD-10-CM | POA: Diagnosis not present

## 2018-12-17 DIAGNOSIS — Z419 Encounter for procedure for purposes other than remedying health state, unspecified: Secondary | ICD-10-CM | POA: Diagnosis not present

## 2018-12-17 DIAGNOSIS — R262 Difficulty in walking, not elsewhere classified: Secondary | ICD-10-CM | POA: Diagnosis not present

## 2018-12-18 DIAGNOSIS — Z96651 Presence of right artificial knee joint: Secondary | ICD-10-CM | POA: Diagnosis not present

## 2018-12-24 DIAGNOSIS — R29898 Other symptoms and signs involving the musculoskeletal system: Secondary | ICD-10-CM | POA: Diagnosis not present

## 2018-12-24 DIAGNOSIS — M25561 Pain in right knee: Secondary | ICD-10-CM | POA: Diagnosis not present

## 2018-12-24 DIAGNOSIS — Z96651 Presence of right artificial knee joint: Secondary | ICD-10-CM | POA: Diagnosis not present

## 2018-12-24 DIAGNOSIS — Z7409 Other reduced mobility: Secondary | ICD-10-CM | POA: Diagnosis not present

## 2018-12-24 DIAGNOSIS — R262 Difficulty in walking, not elsewhere classified: Secondary | ICD-10-CM | POA: Diagnosis not present

## 2018-12-24 DIAGNOSIS — Z789 Other specified health status: Secondary | ICD-10-CM | POA: Diagnosis not present

## 2018-12-24 DIAGNOSIS — Z419 Encounter for procedure for purposes other than remedying health state, unspecified: Secondary | ICD-10-CM | POA: Diagnosis not present

## 2018-12-31 DIAGNOSIS — Z7409 Other reduced mobility: Secondary | ICD-10-CM | POA: Diagnosis not present

## 2018-12-31 DIAGNOSIS — Z419 Encounter for procedure for purposes other than remedying health state, unspecified: Secondary | ICD-10-CM | POA: Diagnosis not present

## 2018-12-31 DIAGNOSIS — Z96651 Presence of right artificial knee joint: Secondary | ICD-10-CM | POA: Diagnosis not present

## 2018-12-31 DIAGNOSIS — Z789 Other specified health status: Secondary | ICD-10-CM | POA: Diagnosis not present

## 2018-12-31 DIAGNOSIS — M25561 Pain in right knee: Secondary | ICD-10-CM | POA: Diagnosis not present

## 2018-12-31 DIAGNOSIS — R262 Difficulty in walking, not elsewhere classified: Secondary | ICD-10-CM | POA: Diagnosis not present

## 2018-12-31 DIAGNOSIS — R29898 Other symptoms and signs involving the musculoskeletal system: Secondary | ICD-10-CM | POA: Diagnosis not present

## 2019-01-05 DIAGNOSIS — Z96651 Presence of right artificial knee joint: Secondary | ICD-10-CM | POA: Diagnosis not present

## 2019-01-05 DIAGNOSIS — M25561 Pain in right knee: Secondary | ICD-10-CM | POA: Diagnosis not present

## 2019-01-05 DIAGNOSIS — R29898 Other symptoms and signs involving the musculoskeletal system: Secondary | ICD-10-CM | POA: Diagnosis not present

## 2019-01-05 DIAGNOSIS — R262 Difficulty in walking, not elsewhere classified: Secondary | ICD-10-CM | POA: Diagnosis not present

## 2019-01-05 DIAGNOSIS — Z789 Other specified health status: Secondary | ICD-10-CM | POA: Diagnosis not present

## 2019-01-05 DIAGNOSIS — Z7409 Other reduced mobility: Secondary | ICD-10-CM | POA: Diagnosis not present

## 2019-01-05 DIAGNOSIS — Z419 Encounter for procedure for purposes other than remedying health state, unspecified: Secondary | ICD-10-CM | POA: Diagnosis not present

## 2019-03-21 DIAGNOSIS — Z23 Encounter for immunization: Secondary | ICD-10-CM | POA: Diagnosis not present

## 2019-04-06 ENCOUNTER — Other Ambulatory Visit: Payer: Self-pay | Admitting: Anesthesiology

## 2019-04-06 ENCOUNTER — Other Ambulatory Visit: Payer: Self-pay | Admitting: Physician Assistant

## 2019-04-06 DIAGNOSIS — Z1231 Encounter for screening mammogram for malignant neoplasm of breast: Secondary | ICD-10-CM

## 2019-04-09 DIAGNOSIS — I10 Essential (primary) hypertension: Secondary | ICD-10-CM | POA: Diagnosis not present

## 2019-04-09 DIAGNOSIS — L309 Dermatitis, unspecified: Secondary | ICD-10-CM | POA: Diagnosis not present

## 2019-04-09 DIAGNOSIS — F322 Major depressive disorder, single episode, severe without psychotic features: Secondary | ICD-10-CM | POA: Diagnosis not present

## 2019-04-09 DIAGNOSIS — J45909 Unspecified asthma, uncomplicated: Secondary | ICD-10-CM | POA: Diagnosis not present

## 2019-04-09 DIAGNOSIS — J309 Allergic rhinitis, unspecified: Secondary | ICD-10-CM | POA: Diagnosis not present

## 2019-04-09 DIAGNOSIS — E559 Vitamin D deficiency, unspecified: Secondary | ICD-10-CM | POA: Diagnosis not present

## 2019-04-09 DIAGNOSIS — E039 Hypothyroidism, unspecified: Secondary | ICD-10-CM | POA: Diagnosis not present

## 2019-04-09 DIAGNOSIS — Z0000 Encounter for general adult medical examination without abnormal findings: Secondary | ICD-10-CM | POA: Diagnosis not present

## 2019-04-09 DIAGNOSIS — N3281 Overactive bladder: Secondary | ICD-10-CM | POA: Diagnosis not present

## 2019-04-10 DIAGNOSIS — Z0000 Encounter for general adult medical examination without abnormal findings: Secondary | ICD-10-CM | POA: Diagnosis not present

## 2019-04-10 DIAGNOSIS — I10 Essential (primary) hypertension: Secondary | ICD-10-CM | POA: Diagnosis not present

## 2019-04-10 DIAGNOSIS — E039 Hypothyroidism, unspecified: Secondary | ICD-10-CM | POA: Diagnosis not present

## 2019-05-18 DIAGNOSIS — H524 Presbyopia: Secondary | ICD-10-CM | POA: Diagnosis not present

## 2019-05-18 DIAGNOSIS — H2513 Age-related nuclear cataract, bilateral: Secondary | ICD-10-CM | POA: Diagnosis not present

## 2019-05-18 DIAGNOSIS — H35033 Hypertensive retinopathy, bilateral: Secondary | ICD-10-CM | POA: Diagnosis not present

## 2019-05-18 DIAGNOSIS — H35363 Drusen (degenerative) of macula, bilateral: Secondary | ICD-10-CM | POA: Diagnosis not present

## 2019-05-18 DIAGNOSIS — H35463 Secondary vitreoretinal degeneration, bilateral: Secondary | ICD-10-CM | POA: Diagnosis not present

## 2019-06-01 ENCOUNTER — Ambulatory Visit
Admission: RE | Admit: 2019-06-01 | Discharge: 2019-06-01 | Disposition: A | Discharge status: Home / Self Care | Payer: Medicare Other | Source: Ambulatory Visit | Attending: Physician Assistant | Admitting: Physician Assistant

## 2019-06-01 ENCOUNTER — Other Ambulatory Visit: Payer: Self-pay

## 2019-06-01 DIAGNOSIS — Z1231 Encounter for screening mammogram for malignant neoplasm of breast: Secondary | ICD-10-CM

## 2019-08-18 DIAGNOSIS — Z23 Encounter for immunization: Secondary | ICD-10-CM | POA: Diagnosis not present

## 2019-08-20 DIAGNOSIS — Z471 Aftercare following joint replacement surgery: Secondary | ICD-10-CM | POA: Diagnosis not present

## 2019-08-20 DIAGNOSIS — Z96652 Presence of left artificial knee joint: Secondary | ICD-10-CM | POA: Diagnosis not present

## 2019-08-24 DIAGNOSIS — R350 Frequency of micturition: Secondary | ICD-10-CM | POA: Diagnosis not present

## 2019-08-24 DIAGNOSIS — E039 Hypothyroidism, unspecified: Secondary | ICD-10-CM | POA: Diagnosis not present

## 2019-08-24 DIAGNOSIS — I10 Essential (primary) hypertension: Secondary | ICD-10-CM | POA: Diagnosis not present

## 2019-09-10 DIAGNOSIS — F339 Major depressive disorder, recurrent, unspecified: Secondary | ICD-10-CM | POA: Diagnosis not present

## 2019-09-10 DIAGNOSIS — F419 Anxiety disorder, unspecified: Secondary | ICD-10-CM | POA: Diagnosis not present

## 2019-10-08 DIAGNOSIS — I10 Essential (primary) hypertension: Secondary | ICD-10-CM | POA: Diagnosis not present

## 2019-10-08 DIAGNOSIS — J309 Allergic rhinitis, unspecified: Secondary | ICD-10-CM | POA: Diagnosis not present

## 2019-10-08 DIAGNOSIS — F419 Anxiety disorder, unspecified: Secondary | ICD-10-CM | POA: Diagnosis not present

## 2019-10-08 DIAGNOSIS — H3530 Unspecified macular degeneration: Secondary | ICD-10-CM | POA: Diagnosis not present

## 2019-10-08 DIAGNOSIS — E039 Hypothyroidism, unspecified: Secondary | ICD-10-CM | POA: Diagnosis not present

## 2019-10-08 DIAGNOSIS — M25511 Pain in right shoulder: Secondary | ICD-10-CM | POA: Diagnosis not present

## 2019-10-08 DIAGNOSIS — G2581 Restless legs syndrome: Secondary | ICD-10-CM | POA: Diagnosis not present

## 2019-10-08 DIAGNOSIS — M797 Fibromyalgia: Secondary | ICD-10-CM | POA: Diagnosis not present

## 2019-10-08 DIAGNOSIS — F339 Major depressive disorder, recurrent, unspecified: Secondary | ICD-10-CM | POA: Diagnosis not present

## 2019-10-08 DIAGNOSIS — J45909 Unspecified asthma, uncomplicated: Secondary | ICD-10-CM | POA: Diagnosis not present

## 2019-10-08 DIAGNOSIS — N3281 Overactive bladder: Secondary | ICD-10-CM | POA: Diagnosis not present

## 2019-12-04 ENCOUNTER — Ambulatory Visit: Payer: 59 | Admitting: Podiatry

## 2020-01-11 ENCOUNTER — Ambulatory Visit: Payer: Medicare Other | Admitting: Podiatry

## 2020-03-14 ENCOUNTER — Other Ambulatory Visit: Payer: Self-pay

## 2020-03-14 ENCOUNTER — Ambulatory Visit (INDEPENDENT_AMBULATORY_CARE_PROVIDER_SITE_OTHER): Payer: Medicare Other | Admitting: Podiatry

## 2020-03-14 ENCOUNTER — Ambulatory Visit (INDEPENDENT_AMBULATORY_CARE_PROVIDER_SITE_OTHER): Payer: Medicare Other

## 2020-03-14 DIAGNOSIS — E039 Hypothyroidism, unspecified: Secondary | ICD-10-CM | POA: Diagnosis not present

## 2020-03-14 DIAGNOSIS — M797 Fibromyalgia: Secondary | ICD-10-CM | POA: Diagnosis not present

## 2020-03-14 DIAGNOSIS — G2581 Restless legs syndrome: Secondary | ICD-10-CM | POA: Diagnosis not present

## 2020-03-14 DIAGNOSIS — E559 Vitamin D deficiency, unspecified: Secondary | ICD-10-CM | POA: Diagnosis not present

## 2020-03-14 DIAGNOSIS — M1990 Unspecified osteoarthritis, unspecified site: Secondary | ICD-10-CM | POA: Diagnosis not present

## 2020-03-14 DIAGNOSIS — S9030XA Contusion of unspecified foot, initial encounter: Secondary | ICD-10-CM | POA: Diagnosis not present

## 2020-03-14 DIAGNOSIS — H3530 Unspecified macular degeneration: Secondary | ICD-10-CM | POA: Diagnosis not present

## 2020-03-14 DIAGNOSIS — Z23 Encounter for immunization: Secondary | ICD-10-CM | POA: Diagnosis not present

## 2020-03-14 DIAGNOSIS — I10 Essential (primary) hypertension: Secondary | ICD-10-CM | POA: Diagnosis not present

## 2020-03-14 DIAGNOSIS — Z0000 Encounter for general adult medical examination without abnormal findings: Secondary | ICD-10-CM | POA: Diagnosis not present

## 2020-03-14 DIAGNOSIS — F339 Major depressive disorder, recurrent, unspecified: Secondary | ICD-10-CM | POA: Diagnosis not present

## 2020-03-14 DIAGNOSIS — Z1159 Encounter for screening for other viral diseases: Secondary | ICD-10-CM | POA: Diagnosis not present

## 2020-03-14 DIAGNOSIS — J309 Allergic rhinitis, unspecified: Secondary | ICD-10-CM | POA: Diagnosis not present

## 2020-03-14 DIAGNOSIS — J45909 Unspecified asthma, uncomplicated: Secondary | ICD-10-CM | POA: Diagnosis not present

## 2020-03-22 ENCOUNTER — Other Ambulatory Visit: Payer: Self-pay | Admitting: Physician Assistant

## 2020-03-22 DIAGNOSIS — E2839 Other primary ovarian failure: Secondary | ICD-10-CM

## 2020-03-22 DIAGNOSIS — Z13820 Encounter for screening for osteoporosis: Secondary | ICD-10-CM

## 2020-03-22 NOTE — Progress Notes (Signed)
   HPI: 59 y.o. female presenting today as a new patient referral from Dr. Gershon Mussel, local podiatrist, for evaluation of a large dorsal bump to the top of the left foot.  Patient states that the lesion has been there for over 5 years.  Gradual onset.  She denies a history of injury.  She has received multiple injections in the past and has tried multiple conservative modalities to alleviate her symptoms without success.  She continues to have pain to the dorsal aspect of the left foot.  Past Medical History:  Diagnosis Date  . Anemia   . Asthma   . Cellulitis   . Environmental allergies   . Fibromyalgia   . Hypertension   . Hypothyroidism   . MRSA infection   . Pneumonia   . Restless leg   . Sleep apnea   . Thyroid disease      Physical Exam: General: The patient is alert and oriented x3 in no acute distress.  Dermatology: Skin is warm, dry and supple bilateral lower extremities. Negative for open lesions or macerations.  Vascular: Palpable pedal pulses bilaterally. No edema or erythema noted. Capillary refill within normal limits.  Neurological: Epicritic and protective threshold grossly intact bilaterally.   Musculoskeletal Exam: Range of motion within normal limits to all pedal and ankle joints bilateral. Muscle strength 5/5 in all groups bilateral.  There is a large semifluctuant, not adhered soft tissue mass likely consistent with a ganglionic cyst to the dorsal aspect of the left midfoot.  There is associated tenderness to palpation.  Radiographic Exam:  Normal osseous mineralization.  There is some degenerative changes noted in osteoarthritis to the midtarsal joints.  Dorsal beaking noted to the dorsal midfoot on lateral view.  No fracture identified.  Assessment: 1.  Ganglion cyst left dorsal midfoot 2.  Exostosis/DJD left dorsal midfoot   Plan of Care:  1. Patient evaluated. X-Rays reviewed.  2. Today we discussed the conservative versus surgical management of the  presenting pathology. The patient opts for surgical management. All possible complications and details of the procedure were explained. All patient questions were answered. No guarantees were expressed or implied. 3. Authorization for surgery was initiated today. Surgery will consist of excision of ganglion cyst left dorsal midfoot.  Exostectomy left dorsal midfoot.   4.  Return to clinic 1 week postop        Edrick Kins, DPM [facility]  Dr. Edrick Kins, DPM    [address], [place] [address]                Office [phone]  Fax [phone]

## 2020-03-28 ENCOUNTER — Ambulatory Visit
Admission: RE | Admit: 2020-03-28 | Discharge: 2020-03-28 | Disposition: A | Discharge status: Home / Self Care | Payer: Medicare Other | Source: Ambulatory Visit | Attending: Physician Assistant | Admitting: Physician Assistant

## 2020-03-28 ENCOUNTER — Other Ambulatory Visit: Payer: Self-pay

## 2020-03-28 DIAGNOSIS — Z13820 Encounter for screening for osteoporosis: Secondary | ICD-10-CM | POA: Diagnosis not present

## 2020-03-28 DIAGNOSIS — E2839 Other primary ovarian failure: Secondary | ICD-10-CM

## 2020-03-28 DIAGNOSIS — Z780 Asymptomatic menopausal state: Secondary | ICD-10-CM | POA: Diagnosis not present

## 2020-03-31 ENCOUNTER — Other Ambulatory Visit: Payer: Self-pay | Admitting: Podiatry

## 2020-03-31 DIAGNOSIS — M898X7 Other specified disorders of bone, ankle and foot: Secondary | ICD-10-CM | POA: Diagnosis not present

## 2020-03-31 DIAGNOSIS — M25775 Osteophyte, left foot: Secondary | ICD-10-CM | POA: Diagnosis not present

## 2020-03-31 DIAGNOSIS — M67472 Ganglion, left ankle and foot: Secondary | ICD-10-CM | POA: Diagnosis not present

## 2020-03-31 MED ORDER — OXYCODONE-ACETAMINOPHEN 5-325 MG PO TABS: 1.0000 | ORAL_TABLET | ORAL | 0 refills | Status: AC | PRN

## 2020-03-31 MED ORDER — MELOXICAM 15 MG PO TABS: 15.0000 mg | ORAL_TABLET | Freq: Every day | ORAL | 1 refills | Status: DC

## 2020-03-31 NOTE — Progress Notes (Signed)
PRN postop 

## 2020-04-01 ENCOUNTER — Telehealth: Payer: Self-pay | Admitting: Podiatry

## 2020-04-01 NOTE — Telephone Encounter (Signed)
Pt called stating the oxyCODONE-acetaminophen (PERCOCET) 5-325 MG, is not helping with pain. She would like to know if you could prescribe something else for her. Please advise.

## 2020-04-06 ENCOUNTER — Ambulatory Visit (INDEPENDENT_AMBULATORY_CARE_PROVIDER_SITE_OTHER): Payer: Medicare Other

## 2020-04-06 ENCOUNTER — Ambulatory Visit (INDEPENDENT_AMBULATORY_CARE_PROVIDER_SITE_OTHER): Payer: Medicare Other | Admitting: Podiatry

## 2020-04-06 ENCOUNTER — Other Ambulatory Visit: Payer: Self-pay

## 2020-04-06 DIAGNOSIS — M898X7 Other specified disorders of bone, ankle and foot: Secondary | ICD-10-CM | POA: Diagnosis not present

## 2020-04-06 DIAGNOSIS — Z9889 Other specified postprocedural states: Secondary | ICD-10-CM

## 2020-04-06 DIAGNOSIS — M79672 Pain in left foot: Secondary | ICD-10-CM

## 2020-04-06 NOTE — Progress Notes (Signed)
   Subjective:  Patient presents today status post left foot ganglion cyst excision with exostectomy left dorsal midfoot. DOS: 03/31/2020.  Patient states that she is feeling much better today.  Friday she was in a significant amount of pain.  Sometimes she has some pain from pressure from the boot.  Otherwise no new complaints at this time.  Past Medical History:  Diagnosis Date  . Anemia   . Asthma   . Cellulitis   . Environmental allergies   . Fibromyalgia   . Hypertension   . Hypothyroidism   . MRSA infection   . Pneumonia   . Restless leg   . Sleep apnea   . Thyroid disease       Objective/Physical Exam Neurovascular status intact.  Skin incisions appear to be well coapted with sutures intact. No sign of infectious process noted. No dehiscence. No active bleeding noted. Moderate edema noted to the surgical extremity.  Radiographic Exam:  Osteotomies sites appear to be stable with routine healing.  Assessment: 1. s/p exostectomy with excision of ganglion cyst left dorsal midfoot. DOS: 03/31/2020   Plan of Care:  1. Patient was evaluated. X-rays reviewed 2.  Dressings changed today. 3.  Patient may begin changing dressings and getting the foot wet.  Recommend antibiotic ointment, a large Band-Aid, and Ace wrap daily 4.  Postsurgical shoe dispensed.  Wear daily.  Discontinue cam boot 5.  Return to clinic in 1 week for suture removal   Edrick Kins, DPM [facility]  Dr. Edrick Kins, DPM    [address], [place] [address]                Office [phone]  Fax [phone]

## 2020-04-07 ENCOUNTER — Other Ambulatory Visit: Payer: Self-pay | Admitting: Podiatry

## 2020-04-07 DIAGNOSIS — M898X7 Other specified disorders of bone, ankle and foot: Secondary | ICD-10-CM

## 2020-04-13 ENCOUNTER — Other Ambulatory Visit: Payer: Self-pay

## 2020-04-13 ENCOUNTER — Ambulatory Visit (INDEPENDENT_AMBULATORY_CARE_PROVIDER_SITE_OTHER): Payer: Medicare Other | Admitting: Podiatry

## 2020-04-13 DIAGNOSIS — M79672 Pain in left foot: Secondary | ICD-10-CM

## 2020-04-13 DIAGNOSIS — Z9889 Other specified postprocedural states: Secondary | ICD-10-CM

## 2020-04-13 DIAGNOSIS — M898X7 Other specified disorders of bone, ankle and foot: Secondary | ICD-10-CM

## 2020-04-13 NOTE — Progress Notes (Signed)
   Subjective:  Patient presents today status post left foot ganglion cyst excision with exostectomy left dorsal midfoot. DOS: 03/31/2020.  Patient is feeling better.  She continues to feel improvement.  She does continue to have some tenderness to the surgical area.  No new complaints at this time.  Past Medical History:  Diagnosis Date  . Anemia   . Asthma   . Cellulitis   . Environmental allergies   . Fibromyalgia   . Hypertension   . Hypothyroidism   . MRSA infection   . Pneumonia   . Restless leg   . Sleep apnea   . Thyroid disease       Objective/Physical Exam Neurovascular status intact.  Skin incisions appear to be well coapted with sutures intact. No sign of infectious process noted. No dehiscence. No active bleeding noted.  There does appear to be some increased edema localized around the incision site.  There is no warmth or no evidence of a clinical infection or cellulitis.   Assessment: 1. s/p exostectomy with excision of ganglion cyst left dorsal midfoot. DOS: 03/31/2020   Plan of Care:  1. Patient was evaluated.  2.  Due to the increased edema were again relieved sutures intact for 1 additional week. 3.  Continue washing the foot daily and reapplying an Ace wrap for compression to reduce the edema 4.  Return to clinic in 1 week for suture removal  Edrick Kins, DPM [facility]  Dr. Edrick Kins, DPM    [address], [place] [address]                Office [phone]  Fax [phone]

## 2020-04-20 ENCOUNTER — Other Ambulatory Visit: Payer: Self-pay

## 2020-04-20 ENCOUNTER — Encounter: Payer: Self-pay | Admitting: Podiatry

## 2020-04-20 ENCOUNTER — Ambulatory Visit (INDEPENDENT_AMBULATORY_CARE_PROVIDER_SITE_OTHER): Payer: Medicare Other | Admitting: Podiatry

## 2020-04-20 DIAGNOSIS — M898X7 Other specified disorders of bone, ankle and foot: Secondary | ICD-10-CM

## 2020-04-20 DIAGNOSIS — M79672 Pain in left foot: Secondary | ICD-10-CM

## 2020-04-20 DIAGNOSIS — Z9889 Other specified postprocedural states: Secondary | ICD-10-CM

## 2020-04-20 NOTE — Progress Notes (Signed)
   Subjective:  Patient presents today status post left foot ganglion cyst excision with exostectomy left dorsal midfoot. DOS: 03/31/2020.  Patient is feeling well.  She has been weightbearing in the surgical shoe.  She is actually been increasing her activity this week.  She states that just the other day she mowed her front lawn.  Past Medical History:  Diagnosis Date  . Anemia   . Asthma   . Cellulitis   . Environmental allergies   . Fibromyalgia   . Hypertension   . Hypothyroidism   . MRSA infection   . Pneumonia   . Restless leg   . Sleep apnea   . Thyroid disease       Objective/Physical Exam Neurovascular status intact.  Skin incisions appear to be well coapted with sutures intact. No sign of infectious process noted. No dehiscence. No active bleeding noted.  There does appear to be some increased edema localized around the incision site however this is significantly improved since last visit.  There is no warmth or no evidence of a clinical infection or cellulitis.   Assessment: 1. s/p exostectomy with excision of ganglion cyst left dorsal midfoot. DOS: 03/31/2020   Plan of Care:  1. Patient was evaluated.  2.  Sutures removed today.   3.  Patient may begin full activity no restrictions  4.  Return to work beginning 04/25/2020  5.  Recommend good supportive shoes.  Discontinue postsurgical shoe  6.  Return to clinic as needed   Edrick Kins, DPM [facility]  Dr. Edrick Kins, DPM    [address], [place] [address]                Office [phone]  Fax [phone]

## 2020-04-27 ENCOUNTER — Encounter: Payer: Medicare Other | Admitting: Podiatry

## 2020-05-25 ENCOUNTER — Ambulatory Visit (INDEPENDENT_AMBULATORY_CARE_PROVIDER_SITE_OTHER): Payer: Medicare Other | Admitting: Podiatry

## 2020-05-25 ENCOUNTER — Other Ambulatory Visit: Payer: Self-pay

## 2020-05-25 DIAGNOSIS — J45909 Unspecified asthma, uncomplicated: Secondary | ICD-10-CM | POA: Insufficient documentation

## 2020-05-25 DIAGNOSIS — M722 Plantar fascial fibromatosis: Secondary | ICD-10-CM

## 2020-05-25 DIAGNOSIS — E559 Vitamin D deficiency, unspecified: Secondary | ICD-10-CM | POA: Insufficient documentation

## 2020-05-25 DIAGNOSIS — E2839 Other primary ovarian failure: Secondary | ICD-10-CM | POA: Insufficient documentation

## 2020-05-25 DIAGNOSIS — F419 Anxiety disorder, unspecified: Secondary | ICD-10-CM | POA: Insufficient documentation

## 2020-05-25 DIAGNOSIS — G2581 Restless legs syndrome: Secondary | ICD-10-CM | POA: Insufficient documentation

## 2020-05-25 DIAGNOSIS — H3530 Unspecified macular degeneration: Secondary | ICD-10-CM | POA: Insufficient documentation

## 2020-05-25 DIAGNOSIS — J309 Allergic rhinitis, unspecified: Secondary | ICD-10-CM | POA: Insufficient documentation

## 2020-05-25 DIAGNOSIS — F339 Major depressive disorder, recurrent, unspecified: Secondary | ICD-10-CM | POA: Insufficient documentation

## 2020-05-25 DIAGNOSIS — N3281 Overactive bladder: Secondary | ICD-10-CM | POA: Insufficient documentation

## 2020-05-25 DIAGNOSIS — Z9889 Other specified postprocedural states: Secondary | ICD-10-CM

## 2020-05-25 DIAGNOSIS — E039 Hypothyroidism, unspecified: Secondary | ICD-10-CM | POA: Insufficient documentation

## 2020-05-25 DIAGNOSIS — I10 Essential (primary) hypertension: Secondary | ICD-10-CM | POA: Insufficient documentation

## 2020-05-25 DIAGNOSIS — M1990 Unspecified osteoarthritis, unspecified site: Secondary | ICD-10-CM | POA: Insufficient documentation

## 2020-05-25 DIAGNOSIS — M797 Fibromyalgia: Secondary | ICD-10-CM | POA: Insufficient documentation

## 2020-05-25 MED ORDER — METHYLPREDNISOLONE 4 MG PO TBPK: ORAL_TABLET | ORAL | 0 refills | Status: AC

## 2020-05-25 MED ORDER — MELOXICAM 15 MG PO TABS: 15.0000 mg | ORAL_TABLET | Freq: Every day | ORAL | 1 refills | Status: AC

## 2020-06-12 MED ORDER — BETAMETHASONE SOD PHOS & ACET 6 (3-3) MG/ML IJ SUSP
3.0000 mg | Freq: Once | INTRAMUSCULAR | Status: AC
  Administered 2020-06-12: 3 mg via INTRAMUSCULAR

## 2020-06-12 NOTE — Progress Notes (Signed)
   Subjective:  Patient presents today status post left foot ganglion cyst excision with exostectomy left dorsal midfoot. DOS: 03/31/2020.  Patient states that today she is doing with a new complaint that is unassociated to her surgical areas.  She states that she has been developing some significant pain to the plantar aspect of the left foot.  Pain is increased over the past few weeks.  She is currently wearing good supportive shoes over she is having significant pain to the arch of the left foot.  She denies history of injury.  She presents for further treatment evaluation  Past Medical History:  Diagnosis Date  . Anemia   . Asthma   . Cellulitis   . Environmental allergies   . Fibromyalgia   . Hypertension   . Hypothyroidism   . MRSA infection   . Pneumonia   . Restless leg   . Sleep apnea   . Thyroid disease       Objective/Physical Exam Neurovascular status intact.  Skin incisions appear to be well coapted and healed. No sign of infectious process noted. No dehiscence. No active bleeding noted.  Negative for any significant edema or erythema  Today there is a new finding of pain on palpation along the mid substance of the plantar fascia left foot across the medial longitudinal arch of the foot.  Findings consistent with a plantar fasciitis  Assessment: 1. s/p exostectomy with excision of ganglion cyst left dorsal midfoot. DOS: 03/31/2020 2.  Plantar fasciitis left; mid substance   Plan of Care:  1. Patient was evaluated.  2.  Injection of 0.5 cc Celestone Soluspan injected along the plantar fascia left foot 3.  Prescription for Medrol Dosepak 4.  Prescription for meloxicam 15 mg daily to take after completion of the Dosepak 5.  OTC power step insoles were provided today wear daily 6.  Return to clinic in 4 weeks  Felecia Shelling, DPM [facility]  Dr. Felecia Shelling, DPM    [address] [place], [place] [address]                 Office [phone]  Fax [phone]

## 2020-07-04 ENCOUNTER — Ambulatory Visit: Payer: Medicare Other | Admitting: Podiatry

## 2020-07-19 DIAGNOSIS — H35363 Drusen (degenerative) of macula, bilateral: Secondary | ICD-10-CM | POA: Diagnosis not present

## 2020-09-12 ENCOUNTER — Other Ambulatory Visit: Payer: Self-pay | Admitting: Physician Assistant

## 2020-09-12 ENCOUNTER — Ambulatory Visit: Payer: No Typology Code available for payment source

## 2020-09-12 DIAGNOSIS — Z0000 Encounter for general adult medical examination without abnormal findings: Secondary | ICD-10-CM

## 2020-10-07 DIAGNOSIS — M25551 Pain in right hip: Secondary | ICD-10-CM | POA: Diagnosis not present

## 2020-10-21 DIAGNOSIS — M5136 Other intervertebral disc degeneration, lumbar region: Secondary | ICD-10-CM | POA: Diagnosis not present

## 2020-10-21 DIAGNOSIS — M25551 Pain in right hip: Secondary | ICD-10-CM | POA: Diagnosis not present

## 2020-10-21 DIAGNOSIS — B351 Tinea unguium: Secondary | ICD-10-CM | POA: Diagnosis not present

## 2020-10-27 DIAGNOSIS — M5441 Lumbago with sciatica, right side: Secondary | ICD-10-CM | POA: Diagnosis not present

## 2020-10-27 DIAGNOSIS — M545 Low back pain, unspecified: Secondary | ICD-10-CM | POA: Diagnosis not present

## 2020-10-27 DIAGNOSIS — M25551 Pain in right hip: Secondary | ICD-10-CM | POA: Diagnosis not present

## 2020-11-01 ENCOUNTER — Other Ambulatory Visit: Payer: Self-pay

## 2020-11-01 ENCOUNTER — Ambulatory Visit
Admission: RE | Admit: 2020-11-01 | Discharge: 2020-11-01 | Disposition: A | Discharge status: Home / Self Care | Payer: Medicare Other | Source: Ambulatory Visit | Attending: Physician Assistant | Admitting: Physician Assistant

## 2020-11-01 DIAGNOSIS — Z1231 Encounter for screening mammogram for malignant neoplasm of breast: Secondary | ICD-10-CM | POA: Diagnosis not present

## 2020-11-01 DIAGNOSIS — Z0000 Encounter for general adult medical examination without abnormal findings: Secondary | ICD-10-CM

## 2020-11-21 DIAGNOSIS — Z6838 Body mass index (BMI) 38.0-38.9, adult: Secondary | ICD-10-CM | POA: Diagnosis not present

## 2020-11-21 DIAGNOSIS — B351 Tinea unguium: Secondary | ICD-10-CM | POA: Diagnosis not present

## 2020-11-21 DIAGNOSIS — M5136 Other intervertebral disc degeneration, lumbar region: Secondary | ICD-10-CM | POA: Diagnosis not present

## 2020-11-21 DIAGNOSIS — M25551 Pain in right hip: Secondary | ICD-10-CM | POA: Diagnosis not present

## 2020-11-28 DIAGNOSIS — M5441 Lumbago with sciatica, right side: Secondary | ICD-10-CM | POA: Diagnosis not present

## 2020-11-28 DIAGNOSIS — M4726 Other spondylosis with radiculopathy, lumbar region: Secondary | ICD-10-CM | POA: Diagnosis not present

## 2020-11-29 DIAGNOSIS — M4726 Other spondylosis with radiculopathy, lumbar region: Secondary | ICD-10-CM | POA: Diagnosis not present

## 2020-12-27 DIAGNOSIS — G2581 Restless legs syndrome: Secondary | ICD-10-CM | POA: Diagnosis not present

## 2020-12-27 DIAGNOSIS — N3281 Overactive bladder: Secondary | ICD-10-CM | POA: Diagnosis not present

## 2020-12-27 DIAGNOSIS — M5136 Other intervertebral disc degeneration, lumbar region: Secondary | ICD-10-CM | POA: Diagnosis not present

## 2020-12-27 DIAGNOSIS — E039 Hypothyroidism, unspecified: Secondary | ICD-10-CM | POA: Diagnosis not present

## 2020-12-27 DIAGNOSIS — I10 Essential (primary) hypertension: Secondary | ICD-10-CM | POA: Diagnosis not present

## 2020-12-27 DIAGNOSIS — M797 Fibromyalgia: Secondary | ICD-10-CM | POA: Diagnosis not present

## 2020-12-27 DIAGNOSIS — J309 Allergic rhinitis, unspecified: Secondary | ICD-10-CM | POA: Diagnosis not present

## 2020-12-27 DIAGNOSIS — F339 Major depressive disorder, recurrent, unspecified: Secondary | ICD-10-CM | POA: Diagnosis not present

## 2020-12-27 DIAGNOSIS — F419 Anxiety disorder, unspecified: Secondary | ICD-10-CM | POA: Diagnosis not present

## 2020-12-27 DIAGNOSIS — J45909 Unspecified asthma, uncomplicated: Secondary | ICD-10-CM | POA: Diagnosis not present

## 2021-02-15 DIAGNOSIS — Z23 Encounter for immunization: Secondary | ICD-10-CM | POA: Diagnosis not present

## 2021-04-14 DIAGNOSIS — Z1211 Encounter for screening for malignant neoplasm of colon: Secondary | ICD-10-CM | POA: Diagnosis not present

## 2021-04-14 DIAGNOSIS — R2689 Other abnormalities of gait and mobility: Secondary | ICD-10-CM | POA: Diagnosis not present

## 2021-04-14 DIAGNOSIS — Z124 Encounter for screening for malignant neoplasm of cervix: Secondary | ICD-10-CM | POA: Diagnosis not present

## 2021-04-14 DIAGNOSIS — G479 Sleep disorder, unspecified: Secondary | ICD-10-CM | POA: Diagnosis not present

## 2021-04-14 DIAGNOSIS — Z0000 Encounter for general adult medical examination without abnormal findings: Secondary | ICD-10-CM | POA: Diagnosis not present

## 2021-04-14 DIAGNOSIS — Z23 Encounter for immunization: Secondary | ICD-10-CM | POA: Diagnosis not present

## 2021-05-12 DIAGNOSIS — M5441 Lumbago with sciatica, right side: Secondary | ICD-10-CM | POA: Diagnosis not present

## 2021-06-16 DIAGNOSIS — U071 COVID-19: Secondary | ICD-10-CM | POA: Diagnosis not present

## 2021-07-07 ENCOUNTER — Ambulatory Visit
Admission: RE | Admit: 2021-07-07 | Discharge: 2021-07-07 | Disposition: A | Discharge status: Home / Self Care | Payer: Medicare Other | Source: Ambulatory Visit | Attending: Physician Assistant | Admitting: Physician Assistant

## 2021-07-07 ENCOUNTER — Other Ambulatory Visit: Payer: Self-pay | Admitting: Physician Assistant

## 2021-07-07 DIAGNOSIS — R0602 Shortness of breath: Secondary | ICD-10-CM

## 2021-07-07 DIAGNOSIS — M2550 Pain in unspecified joint: Secondary | ICD-10-CM | POA: Diagnosis not present

## 2021-07-07 DIAGNOSIS — J45909 Unspecified asthma, uncomplicated: Secondary | ICD-10-CM | POA: Diagnosis not present

## 2021-07-07 DIAGNOSIS — I10 Essential (primary) hypertension: Secondary | ICD-10-CM | POA: Diagnosis not present

## 2021-07-07 DIAGNOSIS — R062 Wheezing: Secondary | ICD-10-CM | POA: Diagnosis not present

## 2021-07-07 DIAGNOSIS — R059 Cough, unspecified: Secondary | ICD-10-CM | POA: Diagnosis not present

## 2021-07-13 DIAGNOSIS — D123 Benign neoplasm of transverse colon: Secondary | ICD-10-CM | POA: Diagnosis not present

## 2021-07-13 DIAGNOSIS — Z800 Family history of malignant neoplasm of digestive organs: Secondary | ICD-10-CM | POA: Diagnosis not present

## 2021-07-18 DIAGNOSIS — D123 Benign neoplasm of transverse colon: Secondary | ICD-10-CM | POA: Diagnosis not present

## 2022-01-25 DIAGNOSIS — R682 Dry mouth, unspecified: Secondary | ICD-10-CM | POA: Diagnosis not present

## 2022-01-25 DIAGNOSIS — M159 Polyosteoarthritis, unspecified: Secondary | ICD-10-CM | POA: Diagnosis not present

## 2022-01-25 DIAGNOSIS — M797 Fibromyalgia: Secondary | ICD-10-CM | POA: Diagnosis not present

## 2022-01-25 DIAGNOSIS — R768 Other specified abnormal immunological findings in serum: Secondary | ICD-10-CM | POA: Diagnosis not present

## 2022-02-26 DIAGNOSIS — Z23 Encounter for immunization: Secondary | ICD-10-CM | POA: Diagnosis not present

## 2022-03-29 DIAGNOSIS — Z23 Encounter for immunization: Secondary | ICD-10-CM | POA: Diagnosis not present

## 2022-04-20 DIAGNOSIS — M797 Fibromyalgia: Secondary | ICD-10-CM | POA: Diagnosis not present

## 2022-04-20 DIAGNOSIS — F339 Major depressive disorder, recurrent, unspecified: Secondary | ICD-10-CM | POA: Diagnosis not present

## 2022-04-20 DIAGNOSIS — N3281 Overactive bladder: Secondary | ICD-10-CM | POA: Diagnosis not present

## 2022-04-20 DIAGNOSIS — J45909 Unspecified asthma, uncomplicated: Secondary | ICD-10-CM | POA: Diagnosis not present

## 2022-04-20 DIAGNOSIS — Z0000 Encounter for general adult medical examination without abnormal findings: Secondary | ICD-10-CM | POA: Diagnosis not present

## 2022-04-20 DIAGNOSIS — I10 Essential (primary) hypertension: Secondary | ICD-10-CM | POA: Diagnosis not present

## 2022-04-20 DIAGNOSIS — H3530 Unspecified macular degeneration: Secondary | ICD-10-CM | POA: Diagnosis not present

## 2022-04-20 DIAGNOSIS — E039 Hypothyroidism, unspecified: Secondary | ICD-10-CM | POA: Diagnosis not present

## 2022-04-20 DIAGNOSIS — M5136 Other intervertebral disc degeneration, lumbar region: Secondary | ICD-10-CM | POA: Diagnosis not present

## 2022-04-20 DIAGNOSIS — F419 Anxiety disorder, unspecified: Secondary | ICD-10-CM | POA: Diagnosis not present

## 2022-04-20 DIAGNOSIS — G2581 Restless legs syndrome: Secondary | ICD-10-CM | POA: Diagnosis not present

## 2022-04-24 ENCOUNTER — Other Ambulatory Visit: Payer: Self-pay | Admitting: Physician Assistant

## 2022-04-24 DIAGNOSIS — Z1231 Encounter for screening mammogram for malignant neoplasm of breast: Secondary | ICD-10-CM

## 2022-05-07 ENCOUNTER — Ambulatory Visit
Admission: RE | Admit: 2022-05-07 | Discharge: 2022-05-07 | Disposition: A | Discharge status: Home / Self Care | Payer: Medicare Other | Source: Ambulatory Visit | Attending: Physician Assistant | Admitting: Physician Assistant

## 2022-05-07 DIAGNOSIS — Z1231 Encounter for screening mammogram for malignant neoplasm of breast: Secondary | ICD-10-CM

## 2022-08-16 DIAGNOSIS — M797 Fibromyalgia: Secondary | ICD-10-CM | POA: Diagnosis not present

## 2022-08-16 DIAGNOSIS — M3500 Sicca syndrome, unspecified: Secondary | ICD-10-CM | POA: Diagnosis not present

## 2022-08-16 DIAGNOSIS — Z758 Other problems related to medical facilities and other health care: Secondary | ICD-10-CM | POA: Diagnosis not present

## 2022-08-16 DIAGNOSIS — Z0289 Encounter for other administrative examinations: Secondary | ICD-10-CM | POA: Diagnosis not present

## 2022-08-16 DIAGNOSIS — R768 Other specified abnormal immunological findings in serum: Secondary | ICD-10-CM | POA: Diagnosis not present

## 2022-08-16 DIAGNOSIS — M159 Polyosteoarthritis, unspecified: Secondary | ICD-10-CM | POA: Diagnosis not present

## 2022-08-22 DIAGNOSIS — M3500 Sicca syndrome, unspecified: Secondary | ICD-10-CM | POA: Diagnosis not present

## 2022-09-03 IMAGING — MG MM DIGITAL SCREENING BILAT W/ TOMO AND CAD
6 of 12 series · 6 of 36 positions shown · non-contrast
Comparison: Previous exam(s).

CLINICAL DATA: Screening.

EXAM:
DIGITAL SCREENING BILATERAL MAMMOGRAM WITH TOMOSYNTHESIS AND CAD
TECHNIQUE: Bilateral screening digital craniocaudal and mediolateral oblique
mammograms were obtained. Bilateral screening digital breast
tomosynthesis was performed. The images were evaluated with
computer-aided detection.

[R MLO synth-2D]
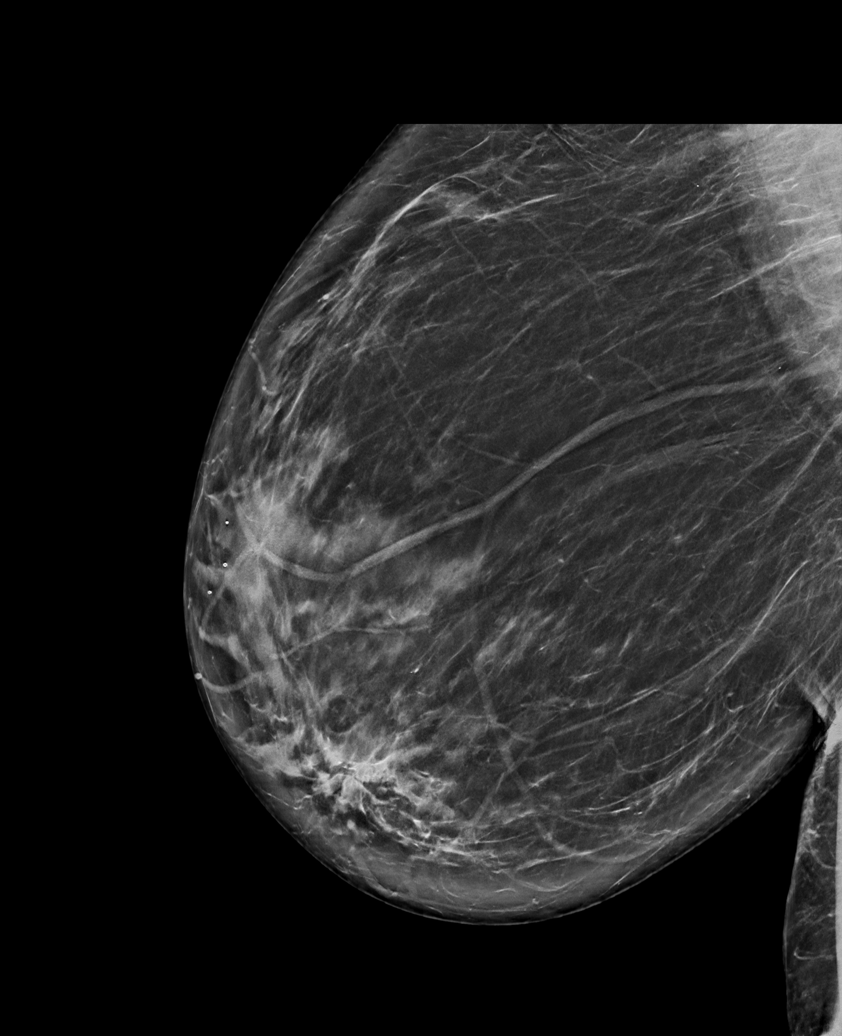

[R CC synth-2D (1 of 2)]
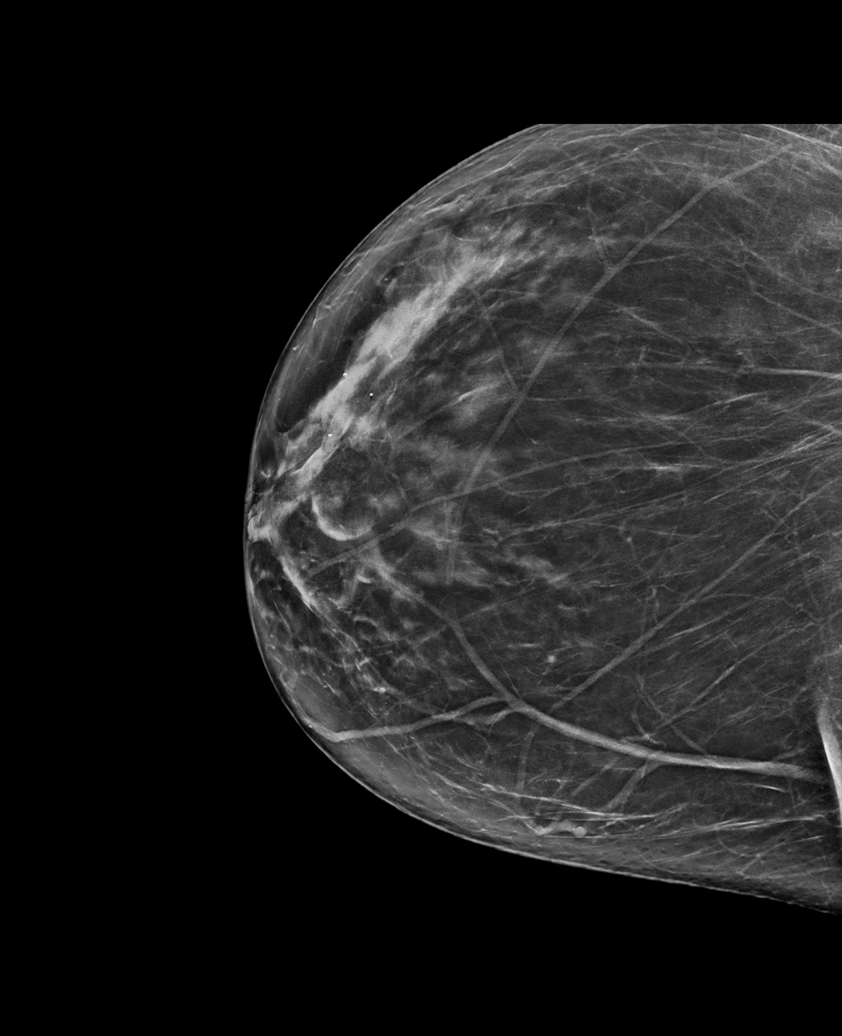

[R CC synth-2D (2 of 2)]
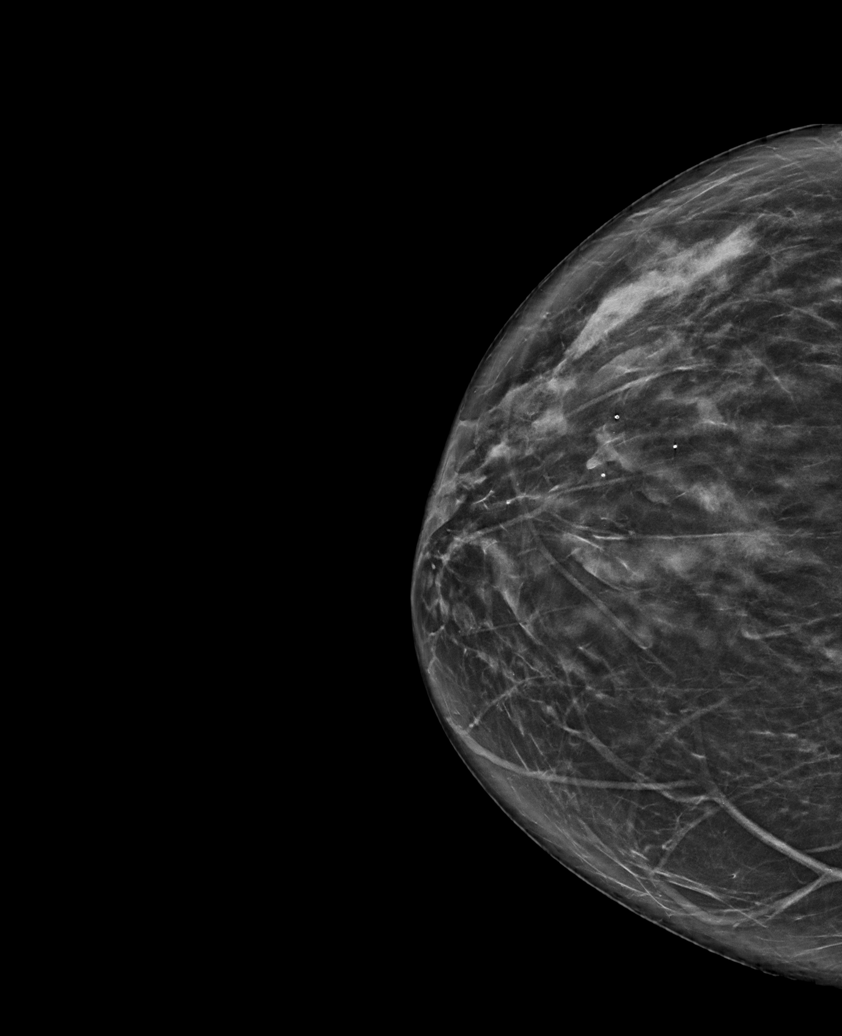

[L CC synth-2D (1 of 2)]
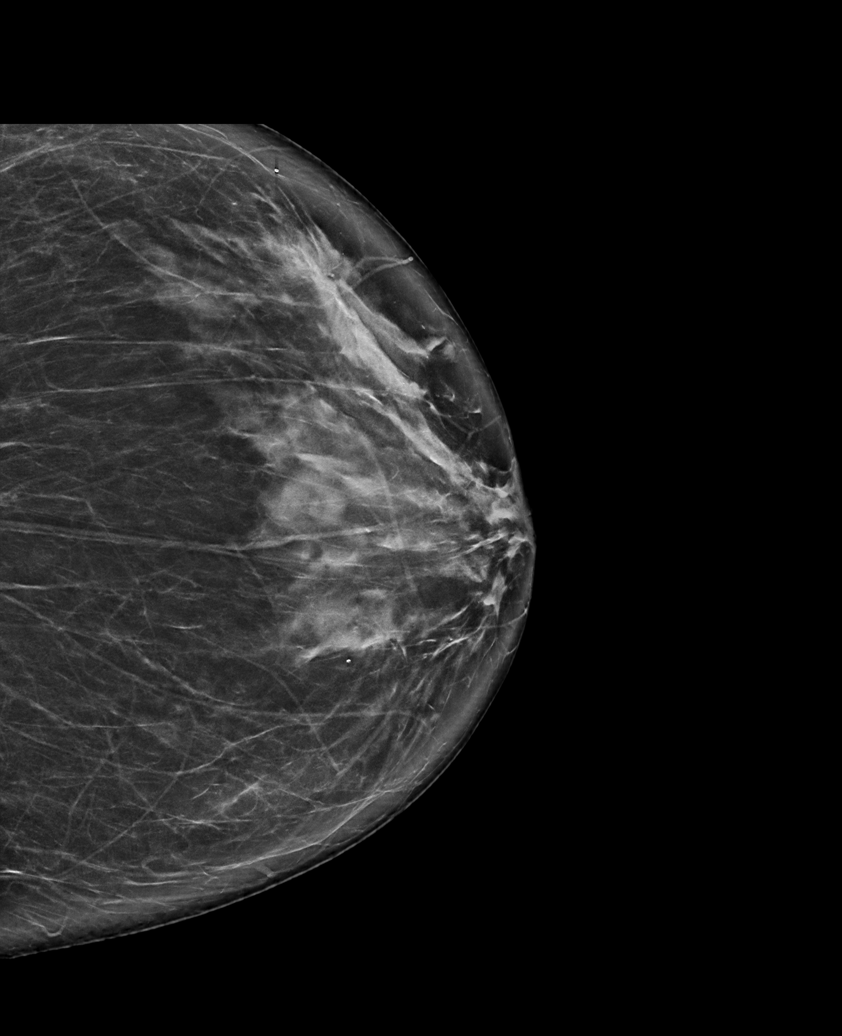

[L CC synth-2D (2 of 2)]
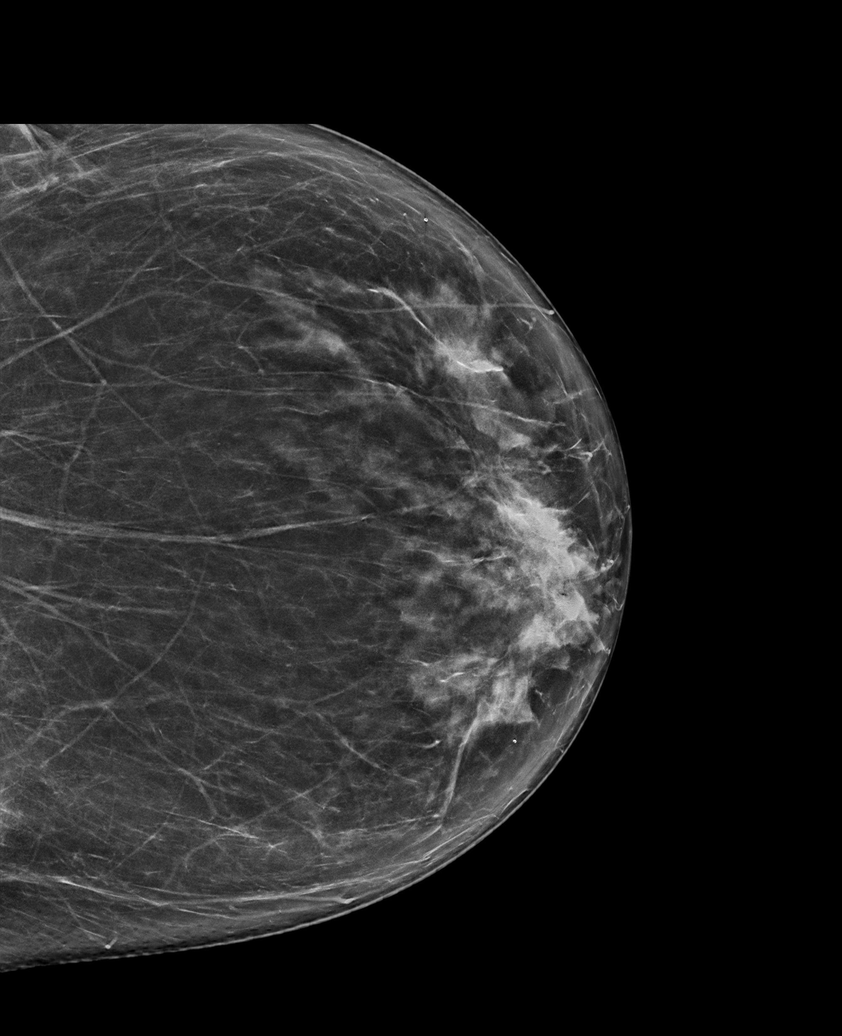

[L MLO synth-2D]
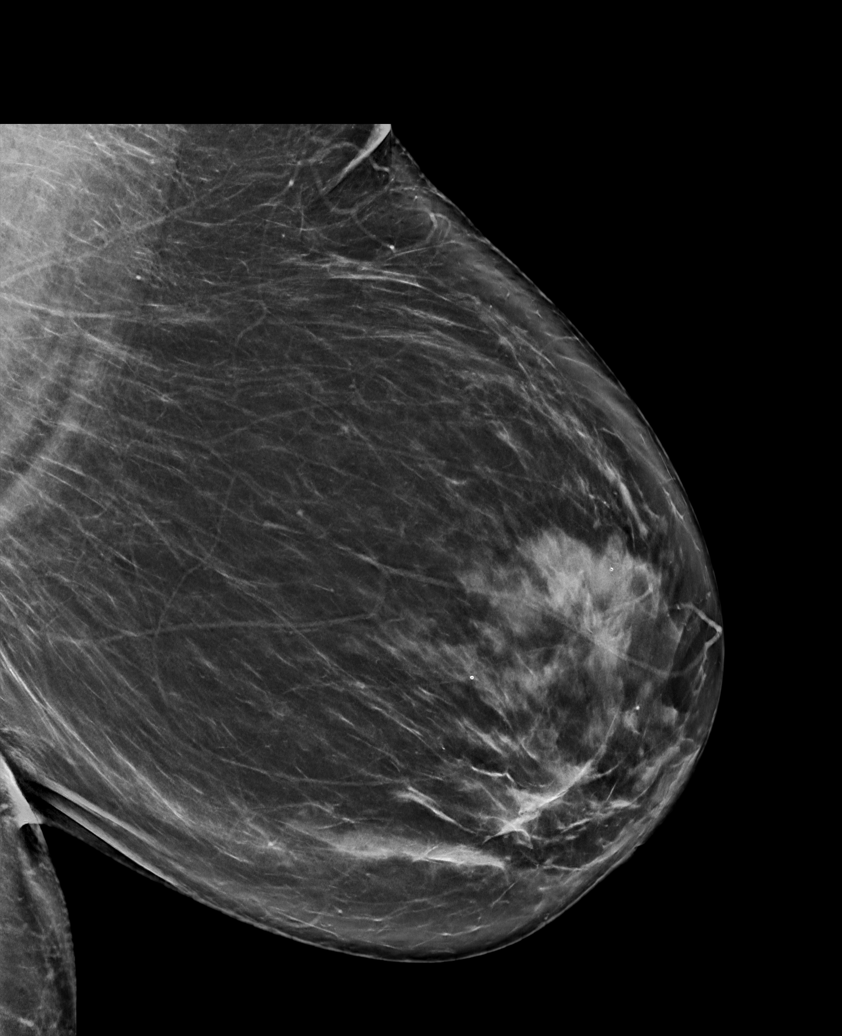

[6 of 36 positions shown; findings below may reference images not displayed]

ACR Breast Density Category b: There are scattered areas of
fibroglandular density.
FINDINGS: There are no findings suspicious for malignancy. The images were
evaluated with computer-aided detection.
IMPRESSION: No mammographic evidence of malignancy. A result letter of this
screening mammogram will be mailed directly to the patient.

RECOMMENDATION:
Screening mammogram in one year. (Code:[id number])

BI-RADS CATEGORY  1: Negative.

## 2022-10-16 DIAGNOSIS — Z114 Encounter for screening for human immunodeficiency virus [HIV]: Secondary | ICD-10-CM | POA: Diagnosis not present

## 2022-10-16 DIAGNOSIS — E611 Iron deficiency: Secondary | ICD-10-CM | POA: Diagnosis not present

## 2022-10-16 DIAGNOSIS — I10 Essential (primary) hypertension: Secondary | ICD-10-CM | POA: Diagnosis not present

## 2022-10-16 DIAGNOSIS — F17200 Nicotine dependence, unspecified, uncomplicated: Secondary | ICD-10-CM | POA: Diagnosis not present

## 2022-10-16 DIAGNOSIS — M3500 Sicca syndrome, unspecified: Secondary | ICD-10-CM | POA: Diagnosis not present

## 2022-10-16 DIAGNOSIS — F411 Generalized anxiety disorder: Secondary | ICD-10-CM | POA: Diagnosis not present

## 2022-10-16 DIAGNOSIS — F329 Major depressive disorder, single episode, unspecified: Secondary | ICD-10-CM | POA: Diagnosis not present

## 2022-10-16 DIAGNOSIS — Z1331 Encounter for screening for depression: Secondary | ICD-10-CM | POA: Diagnosis not present

## 2022-10-16 DIAGNOSIS — E669 Obesity, unspecified: Secondary | ICD-10-CM | POA: Diagnosis not present

## 2022-10-16 DIAGNOSIS — Z136 Encounter for screening for cardiovascular disorders: Secondary | ICD-10-CM | POA: Diagnosis not present

## 2022-10-16 DIAGNOSIS — E038 Other specified hypothyroidism: Secondary | ICD-10-CM | POA: Diagnosis not present

## 2022-10-16 DIAGNOSIS — Z1159 Encounter for screening for other viral diseases: Secondary | ICD-10-CM | POA: Diagnosis not present

## 2022-10-16 DIAGNOSIS — Z131 Encounter for screening for diabetes mellitus: Secondary | ICD-10-CM | POA: Diagnosis not present

## 2022-10-16 DIAGNOSIS — Z9884 Bariatric surgery status: Secondary | ICD-10-CM | POA: Diagnosis not present

## 2022-10-16 DIAGNOSIS — Z23 Encounter for immunization: Secondary | ICD-10-CM | POA: Diagnosis not present

## 2022-10-16 DIAGNOSIS — E039 Hypothyroidism, unspecified: Secondary | ICD-10-CM | POA: Diagnosis not present

## 2022-10-16 DIAGNOSIS — Z1330 Encounter for screening examination for mental health and behavioral disorders, unspecified: Secondary | ICD-10-CM | POA: Diagnosis not present

## 2022-11-14 DIAGNOSIS — F331 Major depressive disorder, recurrent, moderate: Secondary | ICD-10-CM | POA: Diagnosis not present

## 2022-11-14 DIAGNOSIS — Z720 Tobacco use: Secondary | ICD-10-CM | POA: Diagnosis not present

## 2022-11-14 DIAGNOSIS — E673 Hypervitaminosis D: Secondary | ICD-10-CM | POA: Diagnosis not present

## 2022-11-14 DIAGNOSIS — F419 Anxiety disorder, unspecified: Secondary | ICD-10-CM | POA: Diagnosis not present

## 2022-11-15 DIAGNOSIS — M159 Polyosteoarthritis, unspecified: Secondary | ICD-10-CM | POA: Diagnosis not present

## 2022-11-15 DIAGNOSIS — M797 Fibromyalgia: Secondary | ICD-10-CM | POA: Diagnosis not present

## 2022-11-15 DIAGNOSIS — M3500 Sicca syndrome, unspecified: Secondary | ICD-10-CM | POA: Diagnosis not present

## 2022-11-15 DIAGNOSIS — R768 Other specified abnormal immunological findings in serum: Secondary | ICD-10-CM | POA: Diagnosis not present

## 2022-12-27 DIAGNOSIS — F331 Major depressive disorder, recurrent, moderate: Secondary | ICD-10-CM | POA: Diagnosis not present

## 2022-12-27 DIAGNOSIS — E669 Obesity, unspecified: Secondary | ICD-10-CM | POA: Diagnosis not present

## 2022-12-27 DIAGNOSIS — F419 Anxiety disorder, unspecified: Secondary | ICD-10-CM | POA: Diagnosis not present

## 2023-01-29 DIAGNOSIS — F419 Anxiety disorder, unspecified: Secondary | ICD-10-CM | POA: Diagnosis not present

## 2023-01-29 DIAGNOSIS — F331 Major depressive disorder, recurrent, moderate: Secondary | ICD-10-CM | POA: Diagnosis not present

## 2023-01-29 DIAGNOSIS — E669 Obesity, unspecified: Secondary | ICD-10-CM | POA: Diagnosis not present

## 2023-02-08 DIAGNOSIS — Z23 Encounter for immunization: Secondary | ICD-10-CM | POA: Diagnosis not present

## 2023-02-20 DIAGNOSIS — Z87892 Personal history of anaphylaxis: Secondary | ICD-10-CM | POA: Diagnosis not present

## 2023-02-20 DIAGNOSIS — N3281 Overactive bladder: Secondary | ICD-10-CM | POA: Diagnosis not present

## 2023-02-20 DIAGNOSIS — M3500 Sicca syndrome, unspecified: Secondary | ICD-10-CM | POA: Diagnosis not present

## 2023-02-20 DIAGNOSIS — R682 Dry mouth, unspecified: Secondary | ICD-10-CM | POA: Diagnosis not present

## 2023-04-11 DIAGNOSIS — L309 Dermatitis, unspecified: Secondary | ICD-10-CM | POA: Diagnosis not present

## 2023-04-11 DIAGNOSIS — Z9884 Bariatric surgery status: Secondary | ICD-10-CM | POA: Diagnosis not present

## 2023-04-11 DIAGNOSIS — L98499 Non-pressure chronic ulcer of skin of other sites with unspecified severity: Secondary | ICD-10-CM | POA: Diagnosis not present

## 2023-04-11 DIAGNOSIS — Z7182 Exercise counseling: Secondary | ICD-10-CM | POA: Diagnosis not present

## 2023-04-11 DIAGNOSIS — M150 Primary generalized (osteo)arthritis: Secondary | ICD-10-CM | POA: Diagnosis not present

## 2023-04-11 DIAGNOSIS — I10 Essential (primary) hypertension: Secondary | ICD-10-CM | POA: Diagnosis not present

## 2023-04-11 DIAGNOSIS — J45909 Unspecified asthma, uncomplicated: Secondary | ICD-10-CM | POA: Diagnosis not present

## 2023-04-11 DIAGNOSIS — E039 Hypothyroidism, unspecified: Secondary | ICD-10-CM | POA: Diagnosis not present

## 2023-04-11 DIAGNOSIS — K117 Disturbances of salivary secretion: Secondary | ICD-10-CM | POA: Diagnosis not present

## 2023-04-11 DIAGNOSIS — Z79899 Other long term (current) drug therapy: Secondary | ICD-10-CM | POA: Diagnosis not present

## 2023-04-11 DIAGNOSIS — M3500 Sicca syndrome, unspecified: Secondary | ICD-10-CM | POA: Diagnosis not present

## 2023-04-11 DIAGNOSIS — M797 Fibromyalgia: Secondary | ICD-10-CM | POA: Diagnosis not present

## 2023-04-11 DIAGNOSIS — R21 Rash and other nonspecific skin eruption: Secondary | ICD-10-CM | POA: Diagnosis not present

## 2023-04-11 DIAGNOSIS — F331 Major depressive disorder, recurrent, moderate: Secondary | ICD-10-CM | POA: Diagnosis not present

## 2023-04-11 DIAGNOSIS — G8929 Other chronic pain: Secondary | ICD-10-CM | POA: Diagnosis not present

## 2023-04-11 DIAGNOSIS — R1310 Dysphagia, unspecified: Secondary | ICD-10-CM | POA: Diagnosis not present

## 2023-04-11 DIAGNOSIS — Z6841 Body Mass Index (BMI) 40.0 and over, adult: Secondary | ICD-10-CM | POA: Diagnosis not present

## 2023-04-18 DIAGNOSIS — M3500 Sicca syndrome, unspecified: Secondary | ICD-10-CM | POA: Diagnosis not present

## 2023-04-18 DIAGNOSIS — D649 Anemia, unspecified: Secondary | ICD-10-CM | POA: Diagnosis not present

## 2023-04-18 DIAGNOSIS — R21 Rash and other nonspecific skin eruption: Secondary | ICD-10-CM | POA: Diagnosis not present

## 2023-04-18 DIAGNOSIS — L986 Other infiltrative disorders of the skin and subcutaneous tissue: Secondary | ICD-10-CM | POA: Diagnosis not present

## 2023-04-18 DIAGNOSIS — M797 Fibromyalgia: Secondary | ICD-10-CM | POA: Diagnosis not present

## 2023-04-18 DIAGNOSIS — M150 Primary generalized (osteo)arthritis: Secondary | ICD-10-CM | POA: Diagnosis not present

## 2023-04-18 DIAGNOSIS — L280 Lichen simplex chronicus: Secondary | ICD-10-CM | POA: Diagnosis not present

## 2023-04-19 DIAGNOSIS — R1311 Dysphagia, oral phase: Secondary | ICD-10-CM | POA: Diagnosis not present

## 2023-04-19 DIAGNOSIS — N3281 Overactive bladder: Secondary | ICD-10-CM | POA: Diagnosis not present

## 2023-04-19 DIAGNOSIS — F331 Major depressive disorder, recurrent, moderate: Secondary | ICD-10-CM | POA: Diagnosis not present

## 2023-04-19 DIAGNOSIS — E669 Obesity, unspecified: Secondary | ICD-10-CM | POA: Diagnosis not present

## 2023-04-19 DIAGNOSIS — I10 Essential (primary) hypertension: Secondary | ICD-10-CM | POA: Diagnosis not present

## 2023-04-19 DIAGNOSIS — D649 Anemia, unspecified: Secondary | ICD-10-CM | POA: Diagnosis not present

## 2023-04-19 DIAGNOSIS — M3500 Sicca syndrome, unspecified: Secondary | ICD-10-CM | POA: Diagnosis not present

## 2023-05-06 DIAGNOSIS — F419 Anxiety disorder, unspecified: Secondary | ICD-10-CM | POA: Diagnosis not present

## 2023-05-22 DIAGNOSIS — M797 Fibromyalgia: Secondary | ICD-10-CM | POA: Diagnosis not present

## 2023-05-22 DIAGNOSIS — M150 Primary generalized (osteo)arthritis: Secondary | ICD-10-CM | POA: Diagnosis not present

## 2023-05-22 DIAGNOSIS — M3500 Sicca syndrome, unspecified: Secondary | ICD-10-CM | POA: Diagnosis not present

## 2023-05-24 DIAGNOSIS — Z1231 Encounter for screening mammogram for malignant neoplasm of breast: Secondary | ICD-10-CM | POA: Diagnosis not present

## 2023-05-24 DIAGNOSIS — R92333 Mammographic heterogeneous density, bilateral breasts: Secondary | ICD-10-CM | POA: Diagnosis not present

## 2023-06-21 ENCOUNTER — Telehealth (HOSPITAL_BASED_OUTPATIENT_CLINIC_OR_DEPARTMENT_OTHER): Payer: Medicare Other | Admitting: Family

## 2023-06-21 DIAGNOSIS — F419 Anxiety disorder, unspecified: Secondary | ICD-10-CM

## 2023-06-21 DIAGNOSIS — F332 Major depressive disorder, recurrent severe without psychotic features: Secondary | ICD-10-CM | POA: Diagnosis not present

## 2023-06-21 MED ORDER — DOXEPIN HCL 50 MG PO CAPS: 50.0000 mg | ORAL_CAPSULE | Freq: Every day | ORAL | 0 refills | Status: DC
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
## 2023-06-28 DIAGNOSIS — J4541 Moderate persistent asthma with (acute) exacerbation: Secondary | ICD-10-CM | POA: Diagnosis not present

## 2023-07-03 DIAGNOSIS — J4521 Mild intermittent asthma with (acute) exacerbation: Secondary | ICD-10-CM | POA: Diagnosis not present

## 2023-07-08 ENCOUNTER — Telehealth (HOSPITAL_COMMUNITY): Payer: Self-pay

## 2023-07-08 NOTE — Telephone Encounter (Signed)
Pt states that her doxepin (SINEQUAN) 50 MG capsule Is working great for her that was prescribed. Pt states she is feeling better and doesn't see any point of having a therapist and wanted to tell you this directly.

## 2023-07-13 ENCOUNTER — Other Ambulatory Visit (HOSPITAL_COMMUNITY): Payer: Self-pay | Admitting: Family

## 2023-07-15 ENCOUNTER — Other Ambulatory Visit (HOSPITAL_COMMUNITY): Payer: Self-pay

## 2023-07-15 MED ORDER — DOXEPIN HCL 50 MG PO CAPS: 50.0000 mg | ORAL_CAPSULE | Freq: Every day | ORAL | 0 refills | Status: DC

## 2023-07-16 ENCOUNTER — Telehealth (HOSPITAL_BASED_OUTPATIENT_CLINIC_OR_DEPARTMENT_OTHER): Payer: Medicare Other | Admitting: Family

## 2023-07-16 DIAGNOSIS — F332 Major depressive disorder, recurrent severe without psychotic features: Secondary | ICD-10-CM

## 2023-07-16 DIAGNOSIS — F419 Anxiety disorder, unspecified: Secondary | ICD-10-CM

## 2023-07-16 MED ORDER — DOXEPIN HCL 50 MG PO CAPS: 50.0000 mg | ORAL_CAPSULE | Freq: Every day | ORAL | 0 refills | Status: DC

## 2023-07-16 NOTE — Progress Notes (Addendum)
 Virtual Visit via Video Note  I connected with Tonya Webster on 07/16/23 at  3:00 PM EST by a video enabled telemedicine application and verified that I am speaking with the correct person using two identifiers.  Location: Patient: Home Provider: Office   I discussed the limitations of evaluation and management by telemedicine and the availability of in person appointments. The patient expressed understanding and agreed to proceed.  I discussed the assessment and treatment plan with the patient. The patient was provided an opportunity to ask questions and all were answered. The patient agreed with the plan and demonstrated an understanding of the instructions.   The patient was advised to call back or seek an in-person evaluation if the symptoms worsen or if the condition fails to improve as anticipated.  I provided 15 minutes of non-face-to-face time during this encounter.   Staci LOISE Kerns, NP   [facility] MD/PA/NP OP Progress Note  07/16/2023 2:43 PM FILIPPA YARBOUGH  MRN:  [id number]  Chief Complaint: Medication management  HPI: Tonya Webster seen and evaluated via caregility virtual platform.  Patient was initiated on doxepin  50 mg p.o. nightly.  She reports she has been taking and tolerating well.  Denying any medication side effects i.e. headaches nausea vomiting or dizziness.   Medication has been helpful.  Reports some ongoing ruminations related to family stressors.  Reports she feels like she is in a toxic relationship as she and her significant other have communication difficulties.  However, overall reports her mood has improved.  Consideration for following up with therapy services.  No concerns related to suicidal or homicidal ideations.  Discussed following up 3 months for medication management.  Support, encouragement  and reassurance was provided.  Larine Fielding is 63 year old female carries a diagnosis with major depressive disorder generalized anxiety disorder and sleep disturbance.   She reports she is taking Prozac and doxepin  as indicated.  Denying any medication side effects.  Patient had a 2-week follow-up for medication tolerability/adherence.  Will make medications available x 90 days.  No other documented concerns at this time.  Major depressive disorder: Generalized anxiety disorder: Sleep disturbance:   Continue Prozac 40 mg daily-medications prescribed by primary care provider Continue doxepin  50 mg nightly for sleep disturbance  Tonya Webster presents calm cooperative mood congruent with affect.  Speaking in clear tone normal placed good eye contact.  Her thought processes clear coherent and relevant.  No indication that she is responding to internal or external stimuli.  Denied suicidal or homicidal ideations.  Patient has remained calm throughout this assessment and answered all questions appropriately.   Visit Diagnosis:    ICD-10-CM   1. Severe episode of recurrent major depressive disorder, without psychotic features (HCC)  F33.2     2. Anxiety  F41.9       Past Psychiatric History:   Past Medical History:  Past Medical History:  Diagnosis Date   Anemia    Asthma    Cellulitis    Environmental allergies    Fibromyalgia    Hypertension    Hypothyroidism    MRSA infection    Pneumonia    Restless leg    Sleep apnea    Thyroid  disease     Past Surgical History:  Procedure Laterality Date   BACK SURGERY     CESAREAN SECTION     GASTRIC BYPASS     JOINT REPLACEMENT     Left total knee arthroplasty Dr. Rubie 06-16-17   TOTAL KNEE ARTHROPLASTY Left 06/16/2018  Procedure: TOTAL KNEE ARTHROPLASTY;  Surgeon: Rubie Kemps, MD;  Location: [facility];  Service: Orthopedics;  Laterality: Left;   TOTAL KNEE ARTHROPLASTY Right 12/08/2018   Procedure: TOTAL KNEE ARTHROPLASTY;  Surgeon: Rubie Kemps, MD;  Location: [facility];  Service: Orthopedics;  Laterality: Right;    Family Psychiatric History:   Family History:  Family History  Problem Relation Age of Onset    Breast cancer Neg Hx     Social History:  Social History   Socioeconomic History   Marital status: Media Planner    Spouse name: Not on file   Number of children: Not on file   Years of education: Not on file   Highest education level: Not on file  Occupational History   Not on file  Tobacco Use   Smoking status: Never   Smokeless tobacco: Never  Vaping Use   Vaping status: Never Used  Substance and Sexual Activity   Alcohol use: No   Drug use: No   Sexual activity: Not Currently  Other Topics Concern   Not on file  Social History Narrative   Not on file   Social Drivers of Health   Financial Resource Strain: Patient Declined (10/09/2022)   Received from [facility]   Overall Financial Resource Strain (CARDIA)    Difficulty of Paying Living Expenses: Patient declined  Food Insecurity: No Food Insecurity (04/11/2023)   Received from [facility]   Hunger Vital Sign    Worried About Running Out of Food in the Last Year: Never true    Ran Out of Food in the Last Year: Never true  Transportation Needs: No Transportation Needs (04/11/2023)   Received from [facility] - Transportation    In the past 12 months, has lack of transportation kept you from medical appointments or from getting medications?: No    Lack of Transportation (Non-Medical): No  Physical Activity: Not on file  Stress: Not on file  Social Connections: Not on file    Allergies:  Allergies  Allergen Reactions   Soy Allergy (Do Not Select) Anaphylaxis    Itching, and swelling of throat    Dust Mite Extract Other (See Comments)    Dust ragweed, mold, pollen-environmental allergies     Metabolic Disorder Labs: No results found for: HGBA1C, MPG No results found for: PROLACTIN No results found for: CHOL, TRIG, HDL, CHOLHDL, VLDL, LDLCALC No results found for: TSH  Therapeutic Level Labs: No results found for:  LITHIUM No results found for: VALPROATE No results found for: CBMZ  Current Medications: Current Outpatient Medications  Medication Sig Dispense Refill   albuterol  (VENTOLIN  HFA) 108 (90 Base) MCG/ACT inhaler Inhale 2 puffs into the lungs every 6 (six) hours as needed for wheezing or shortness of breath.     aspirin  81 MG chewable tablet 1 tablet     budesonide-formoterol  (SYMBICORT) 160-4.5 MCG/ACT inhaler Inhale 2 puffs into the lungs 2 (two) times daily as needed (for respiratory issues.).     busPIRone  (BUSPAR ) 5 MG tablet Take 5 mg by mouth 2 (two) times daily.  4   citalopram  (CELEXA ) 40 MG tablet Take 40 mg by mouth every evening.      doxepin  (SINEQUAN ) 50 MG capsule Take 1 capsule (50 mg total) by mouth at bedtime. 90 capsule 0   fluticasone (FLONASE) 50 MCG/ACT nasal spray Place 2 sprays into both nostrils daily as needed for allergies or rhinitis.     gabapentin  (NEURONTIN )  600 MG tablet Take 600 mg by mouth 4 (four) times daily as needed (pain).      hydrochlorothiazide (HYDRODIURIL) 25 MG tablet Take 25 mg by mouth daily.     levothyroxine  (SYNTHROID , LEVOTHROID) 125 MCG tablet Take 125 mcg by mouth daily before breakfast.  1   meloxicam  (MOBIC ) 15 MG tablet Take 1 tablet (15 mg total) by mouth daily. 30 tablet 1   methocarbamol  (ROBAXIN ) 500 MG tablet Take 1-2 tablets (500-1,000 mg total) by mouth every 6 (six) hours as needed for muscle spasms. 60 tablet 0   methylPREDNISolone  (MEDROL  DOSEPAK) 4 MG TBPK tablet 6 day dose pack - take as directed 21 tablet 0   metoprolol succinate (TOPROL-XL) 25 MG 24 hr tablet Take 25 mg by mouth daily.     montelukast (SINGULAIR) 10 MG tablet Take 10 mg by mouth at bedtime.     Multiple Vitamins-Minerals (BARIATRIC MULTIVITAMINS/IRON PO) Take 2 capsules by mouth daily.     oxybutynin  (DITROPAN ) 5 MG tablet Take 5 mg by mouth 2 (two) times daily.     oxyCODONE -acetaminophen  (PERCOCET) 5-325 MG tablet Take 1 tablet by mouth every 4 (four)  hours as needed for severe pain. 30 tablet 0   traMADol  (ULTRAM ) 50 MG tablet Take 1-2 tablets (50-100 mg total) by mouth every 6 (six) hours as needed. 40 tablet 0   trandolapril -verapamil  (TARKA ) 2-240 MG tablet Take 1 tablet by mouth every evening.      triamcinolone (KENALOG) 0.1 % 1 application     Vitamin D, Ergocalciferol, (DRISDOL) 1.25 MG (50000 UT) CAPS capsule Take 50,000 Units by mouth every Friday.  3   No current facility-administered medications for this visit.     Musculoskeletal: Caregility virtual platform  Psychiatric Specialty Exam: Review of Systems  Last menstrual period 07/27/2013.There is no height or weight on file to calculate BMI.  General Appearance: Casual  Eye Contact:  Good  Speech:  Clear and Coherent  Volume:  Normal  Mood:  Euthymic  Affect:  Congruent  Thought Process:  Coherent  Orientation:  Full (Time, Place, and Person)  Thought Content: Logical   Suicidal Thoughts:  No  Homicidal Thoughts:  No  Memory:  Immediate;   Good Recent;   Good  Judgement:  Good  Insight:  Good  Psychomotor Activity:  Normal  Concentration:  Concentration: Good  Recall:  Good  Fund of Knowledge: Good  Language: Good  Akathisia:  No  Handed:  Right  AIMS (if indicated): done  Assets:  Communication Skills Desire for Improvement Social Support  ADL's:  Intact  Cognition: WNL  Sleep:  Fair improving   Screenings: PHQ2-9    Flowsheet Row Video [facility]  PHQ-2 Total Score 5  PHQ-9 Total Score 18        Assessment and Plan: Sanye Mccrone 63 year old female presents for medication management follow-up appointment.  She was initiated on doxepin  50 mg nightly for sleep disturbance.  She reports she has been taking and tolerating medications well.  Overall her mood has improved since taking medications as her sleep hygiene has returned back to baseline.  No concerns related to medication  side effects.  Patient to follow-up 3 months for medication management.   Major depressive disorder: Generalized anxiety disorder: Sleep disturbance:   Continue Prozac 40 mg daily-medications prescribed by primary care provider Continue doxepin  50 mg nightly for sleep disturbance   Collaboration of Care: Collaboration of Care: Other consideration for  therapy services  Patient/Guardian was advised Release of Information must be obtained prior to any record release in order to collaborate their care with an outside provider. Patient/Guardian was advised if they have not already done so to contact the registration department to sign all necessary forms in order for us  to release information regarding their care.   Consent: Patient/Guardian gives verbal consent for treatment and assignment of benefits for services provided during this visit. Patient/Guardian expressed understanding and agreed to proceed.    Staci LOISE Kerns, NP 07/16/2023, 2:43 PM

## 2023-07-31 DIAGNOSIS — E669 Obesity, unspecified: Secondary | ICD-10-CM | POA: Diagnosis not present

## 2023-07-31 DIAGNOSIS — N3281 Overactive bladder: Secondary | ICD-10-CM | POA: Diagnosis not present

## 2023-08-09 DIAGNOSIS — E669 Obesity, unspecified: Secondary | ICD-10-CM | POA: Diagnosis not present

## 2023-08-09 DIAGNOSIS — E039 Hypothyroidism, unspecified: Secondary | ICD-10-CM | POA: Diagnosis not present

## 2023-08-09 DIAGNOSIS — Z1211 Encounter for screening for malignant neoplasm of colon: Secondary | ICD-10-CM | POA: Diagnosis not present

## 2023-08-09 DIAGNOSIS — R102 Pelvic and perineal pain: Secondary | ICD-10-CM | POA: Diagnosis not present

## 2023-08-09 DIAGNOSIS — Z113 Encounter for screening for infections with a predominantly sexual mode of transmission: Secondary | ICD-10-CM | POA: Diagnosis not present

## 2023-08-09 DIAGNOSIS — I10 Essential (primary) hypertension: Secondary | ICD-10-CM | POA: Diagnosis not present

## 2023-08-09 DIAGNOSIS — E673 Hypervitaminosis D: Secondary | ICD-10-CM | POA: Diagnosis not present

## 2023-08-09 DIAGNOSIS — E559 Vitamin D deficiency, unspecified: Secondary | ICD-10-CM | POA: Diagnosis not present

## 2023-08-09 DIAGNOSIS — Z6841 Body Mass Index (BMI) 40.0 and over, adult: Secondary | ICD-10-CM | POA: Diagnosis not present

## 2023-08-22 DIAGNOSIS — M7661 Achilles tendinitis, right leg: Secondary | ICD-10-CM | POA: Diagnosis not present

## 2023-08-22 DIAGNOSIS — M3500 Sicca syndrome, unspecified: Secondary | ICD-10-CM | POA: Diagnosis not present

## 2023-08-22 DIAGNOSIS — M797 Fibromyalgia: Secondary | ICD-10-CM | POA: Diagnosis not present

## 2023-08-22 DIAGNOSIS — M150 Primary generalized (osteo)arthritis: Secondary | ICD-10-CM | POA: Diagnosis not present

## 2023-08-24 ENCOUNTER — Other Ambulatory Visit (HOSPITAL_COMMUNITY): Payer: Self-pay | Admitting: Family

## 2023-08-26 ENCOUNTER — Other Ambulatory Visit (HOSPITAL_COMMUNITY): Payer: Self-pay | Admitting: Family

## 2023-08-26 MED ORDER — DOXEPIN HCL 50 MG PO CAPS: 50.0000 mg | ORAL_CAPSULE | Freq: Every day | ORAL | 1 refills | Status: AC

## 2023-08-26 NOTE — Progress Notes (Signed)
 Resent medication to [facility], Doxpin 50 mg 90 tablets with 1 refill

## 2023-09-10 DIAGNOSIS — R509 Fever, unspecified: Secondary | ICD-10-CM | POA: Diagnosis not present

## 2023-09-10 DIAGNOSIS — J101 Influenza due to other identified influenza virus with other respiratory manifestations: Secondary | ICD-10-CM | POA: Diagnosis not present

## 2023-09-10 DIAGNOSIS — R058 Other specified cough: Secondary | ICD-10-CM | POA: Diagnosis not present

## 2023-09-10 DIAGNOSIS — R32 Unspecified urinary incontinence: Secondary | ICD-10-CM | POA: Diagnosis not present

## 2023-09-10 DIAGNOSIS — J4531 Mild persistent asthma with (acute) exacerbation: Secondary | ICD-10-CM | POA: Diagnosis not present

## 2023-09-10 DIAGNOSIS — Z6841 Body Mass Index (BMI) 40.0 and over, adult: Secondary | ICD-10-CM | POA: Diagnosis not present
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
## 2023-09-14 ENCOUNTER — Telehealth (HOSPITAL_COMMUNITY): Payer: Self-pay | Admitting: Psychiatry

## 2023-09-14 ENCOUNTER — Ambulatory Visit (HOSPITAL_COMMUNITY): Admitting: Psychiatry

## 2023-09-14 ENCOUNTER — Encounter (HOSPITAL_COMMUNITY): Payer: Self-pay

## 2023-09-14 NOTE — Telephone Encounter (Signed)
 Patient was scheduled with this writer for Saturday clinic on 09/14/23 to be established as new patient for psychiatric medication management. On chart review, patient is already established with Hillery Jacks, NP and was last seen for psychiatric medication management on 07/16/23.   CMA attempted to reach out to patient morning of appointment to clarify however was unable to reach. Patient joined Armed forces training and education officer at appointment time. This Clinical research associate introduced herself and purpose of today's visit and patient reported she already has a medication management provider and has no issues with her medications (rx Doxepin by our clinic and reports this is working well; has adequate supply of medication). She states she was told purpose of today's visit was to meet with a therapist and possibly be started in groups. Apologized to patient for this misinformation and clarified that this clinic is purely for management of medications. She expressed frustration about this mixup however was overall understanding. She denies any issues/concerns pertaining to her psychiatric medications and opts to cancel today's visit. Instructed patient that I will have scheduler reach out to her to get follow-up scheduled with Hillery Jacks, NP and discuss referral for therapy. She denies SI/HI or any acute safety concerns.   Daine Gip, MD 09/14/23

## 2023-10-08 DIAGNOSIS — R399 Unspecified symptoms and signs involving the genitourinary system: Secondary | ICD-10-CM | POA: Diagnosis not present

## 2023-10-08 DIAGNOSIS — N3946 Mixed incontinence: Secondary | ICD-10-CM | POA: Diagnosis not present

## 2023-10-10 DIAGNOSIS — Z6841 Body Mass Index (BMI) 40.0 and over, adult: Secondary | ICD-10-CM | POA: Diagnosis not present

## 2023-10-10 DIAGNOSIS — F329 Major depressive disorder, single episode, unspecified: Secondary | ICD-10-CM | POA: Diagnosis not present

## 2023-10-10 DIAGNOSIS — R0789 Other chest pain: Secondary | ICD-10-CM | POA: Diagnosis not present

## 2023-10-10 DIAGNOSIS — I10 Essential (primary) hypertension: Secondary | ICD-10-CM | POA: Diagnosis not present

## 2023-10-10 DIAGNOSIS — R0989 Other specified symptoms and signs involving the circulatory and respiratory systems: Secondary | ICD-10-CM | POA: Diagnosis not present

## 2023-10-10 DIAGNOSIS — Z9189 Other specified personal risk factors, not elsewhere classified: Secondary | ICD-10-CM | POA: Diagnosis not present

## 2023-10-10 DIAGNOSIS — N3946 Mixed incontinence: Secondary | ICD-10-CM | POA: Diagnosis not present

## 2023-10-31 DIAGNOSIS — M25512 Pain in left shoulder: Secondary | ICD-10-CM | POA: Diagnosis not present

## 2023-10-31 DIAGNOSIS — M25511 Pain in right shoulder: Secondary | ICD-10-CM | POA: Diagnosis not present

## 2023-10-31 DIAGNOSIS — M67912 Unspecified disorder of synovium and tendon, left shoulder: Secondary | ICD-10-CM | POA: Diagnosis not present

## 2023-11-08 DIAGNOSIS — Z79899 Other long term (current) drug therapy: Secondary | ICD-10-CM | POA: Diagnosis not present

## 2023-11-08 DIAGNOSIS — I10 Essential (primary) hypertension: Secondary | ICD-10-CM | POA: Diagnosis not present

## 2023-11-08 DIAGNOSIS — Z860100 Personal history of colon polyps, unspecified: Secondary | ICD-10-CM | POA: Diagnosis not present

## 2023-11-08 DIAGNOSIS — Z7722 Contact with and (suspected) exposure to environmental tobacco smoke (acute) (chronic): Secondary | ICD-10-CM | POA: Diagnosis not present

## 2023-11-08 DIAGNOSIS — D122 Benign neoplasm of ascending colon: Secondary | ICD-10-CM | POA: Diagnosis not present

## 2023-11-08 DIAGNOSIS — D120 Benign neoplasm of cecum: Secondary | ICD-10-CM | POA: Diagnosis not present

## 2023-11-08 DIAGNOSIS — D649 Anemia, unspecified: Secondary | ICD-10-CM | POA: Diagnosis not present

## 2023-11-08 DIAGNOSIS — D126 Benign neoplasm of colon, unspecified: Secondary | ICD-10-CM | POA: Diagnosis not present

## 2023-11-08 DIAGNOSIS — Z6841 Body Mass Index (BMI) 40.0 and over, adult: Secondary | ICD-10-CM | POA: Diagnosis not present

## 2023-11-08 DIAGNOSIS — Z9884 Bariatric surgery status: Secondary | ICD-10-CM | POA: Diagnosis not present

## 2023-11-08 DIAGNOSIS — Z91018 Allergy to other foods: Secondary | ICD-10-CM | POA: Diagnosis not present

## 2023-11-08 DIAGNOSIS — K5730 Diverticulosis of large intestine without perforation or abscess without bleeding: Secondary | ICD-10-CM | POA: Diagnosis not present

## 2023-11-08 DIAGNOSIS — D121 Benign neoplasm of appendix: Secondary | ICD-10-CM | POA: Diagnosis not present

## 2023-11-08 DIAGNOSIS — Z91048 Other nonmedicinal substance allergy status: Secondary | ICD-10-CM | POA: Diagnosis not present

## 2023-11-08 DIAGNOSIS — K640 First degree hemorrhoids: Secondary | ICD-10-CM | POA: Diagnosis not present

## 2023-11-08 DIAGNOSIS — Z1211 Encounter for screening for malignant neoplasm of colon: Secondary | ICD-10-CM | POA: Diagnosis not present

## 2023-11-08 DIAGNOSIS — J45909 Unspecified asthma, uncomplicated: Secondary | ICD-10-CM | POA: Diagnosis not present

## 2023-11-08 DIAGNOSIS — Z800 Family history of malignant neoplasm of digestive organs: Secondary | ICD-10-CM | POA: Diagnosis not present

## 2023-11-14 DIAGNOSIS — Z87440 Personal history of urinary (tract) infections: Secondary | ICD-10-CM | POA: Diagnosis not present

## 2023-11-14 DIAGNOSIS — I10 Essential (primary) hypertension: Secondary | ICD-10-CM | POA: Diagnosis not present

## 2023-11-14 DIAGNOSIS — H6123 Impacted cerumen, bilateral: Secondary | ICD-10-CM | POA: Diagnosis not present

## 2023-11-14 DIAGNOSIS — R42 Dizziness and giddiness: Secondary | ICD-10-CM | POA: Diagnosis not present

## 2023-11-14 DIAGNOSIS — R399 Unspecified symptoms and signs involving the genitourinary system: Secondary | ICD-10-CM | POA: Diagnosis not present

## 2023-11-19 DIAGNOSIS — E669 Obesity, unspecified: Secondary | ICD-10-CM | POA: Diagnosis not present

## 2023-11-19 DIAGNOSIS — N390 Urinary tract infection, site not specified: Secondary | ICD-10-CM | POA: Diagnosis not present

## 2023-11-19 DIAGNOSIS — H8110 Benign paroxysmal vertigo, unspecified ear: Secondary | ICD-10-CM | POA: Diagnosis not present

## 2023-11-19 DIAGNOSIS — Z6841 Body Mass Index (BMI) 40.0 and over, adult: Secondary | ICD-10-CM | POA: Diagnosis not present

## 2023-11-19 DIAGNOSIS — F331 Major depressive disorder, recurrent, moderate: Secondary | ICD-10-CM | POA: Diagnosis not present

## 2023-11-19 DIAGNOSIS — I10 Essential (primary) hypertension: Secondary | ICD-10-CM | POA: Diagnosis not present

## 2023-11-20 DIAGNOSIS — H53453 Other localized visual field defect, bilateral: Secondary | ICD-10-CM | POA: Diagnosis not present

## 2023-11-20 DIAGNOSIS — H3530 Unspecified macular degeneration: Secondary | ICD-10-CM | POA: Diagnosis not present

## 2023-12-05 DIAGNOSIS — M797 Fibromyalgia: Secondary | ICD-10-CM | POA: Diagnosis not present

## 2023-12-05 DIAGNOSIS — M3500 Sicca syndrome, unspecified: Secondary | ICD-10-CM | POA: Diagnosis not present

## 2023-12-05 DIAGNOSIS — M150 Primary generalized (osteo)arthritis: Secondary | ICD-10-CM | POA: Diagnosis not present

## 2023-12-10 DIAGNOSIS — F339 Major depressive disorder, recurrent, unspecified: Secondary | ICD-10-CM | POA: Diagnosis not present

## 2023-12-10 DIAGNOSIS — B001 Herpesviral vesicular dermatitis: Secondary | ICD-10-CM | POA: Diagnosis not present

## 2023-12-10 DIAGNOSIS — I10 Essential (primary) hypertension: Secondary | ICD-10-CM | POA: Diagnosis not present

## 2023-12-10 DIAGNOSIS — R32 Unspecified urinary incontinence: Secondary | ICD-10-CM | POA: Diagnosis not present

## 2023-12-10 DIAGNOSIS — G4733 Obstructive sleep apnea (adult) (pediatric): Secondary | ICD-10-CM | POA: Diagnosis not present

## 2023-12-10 DIAGNOSIS — E669 Obesity, unspecified: Secondary | ICD-10-CM | POA: Diagnosis not present

## 2023-12-19 DIAGNOSIS — N3946 Mixed incontinence: Secondary | ICD-10-CM | POA: Diagnosis not present

## 2023-12-30 DIAGNOSIS — R42 Dizziness and giddiness: Secondary | ICD-10-CM | POA: Diagnosis not present

## 2024-01-03 DIAGNOSIS — N390 Urinary tract infection, site not specified: Secondary | ICD-10-CM | POA: Diagnosis not present

## 2024-01-13 DIAGNOSIS — J4520 Mild intermittent asthma, uncomplicated: Secondary | ICD-10-CM | POA: Diagnosis not present

## 2024-01-13 DIAGNOSIS — H6123 Impacted cerumen, bilateral: Secondary | ICD-10-CM | POA: Diagnosis not present

## 2024-01-13 DIAGNOSIS — H9193 Unspecified hearing loss, bilateral: Secondary | ICD-10-CM | POA: Diagnosis not present

## 2024-01-13 DIAGNOSIS — J4521 Mild intermittent asthma with (acute) exacerbation: Secondary | ICD-10-CM | POA: Diagnosis not present

## 2024-01-22 DIAGNOSIS — Z6838 Body mass index (BMI) 38.0-38.9, adult: Secondary | ICD-10-CM | POA: Diagnosis not present

## 2024-01-22 DIAGNOSIS — I10 Essential (primary) hypertension: Secondary | ICD-10-CM | POA: Diagnosis not present

## 2024-01-22 DIAGNOSIS — N3281 Overactive bladder: Secondary | ICD-10-CM | POA: Diagnosis not present

## 2024-01-22 DIAGNOSIS — G4733 Obstructive sleep apnea (adult) (pediatric): Secondary | ICD-10-CM | POA: Diagnosis not present

## 2024-01-22 DIAGNOSIS — N3946 Mixed incontinence: Secondary | ICD-10-CM | POA: Diagnosis not present

## 2024-01-22 DIAGNOSIS — F331 Major depressive disorder, recurrent, moderate: Secondary | ICD-10-CM | POA: Diagnosis not present

## 2024-01-22 DIAGNOSIS — J4520 Mild intermittent asthma, uncomplicated: Secondary | ICD-10-CM | POA: Diagnosis not present

## 2024-01-22 DIAGNOSIS — Z6841 Body Mass Index (BMI) 40.0 and over, adult: Secondary | ICD-10-CM | POA: Diagnosis not present

## 2024-01-27 DIAGNOSIS — L299 Pruritus, unspecified: Secondary | ICD-10-CM | POA: Diagnosis not present

## 2024-01-27 DIAGNOSIS — R2689 Other abnormalities of gait and mobility: Secondary | ICD-10-CM | POA: Diagnosis not present

## 2024-01-27 DIAGNOSIS — L280 Lichen simplex chronicus: Secondary | ICD-10-CM | POA: Diagnosis not present

## 2024-01-27 DIAGNOSIS — L820 Inflamed seborrheic keratosis: Secondary | ICD-10-CM | POA: Diagnosis not present

## 2024-01-27 DIAGNOSIS — M3500 Sicca syndrome, unspecified: Secondary | ICD-10-CM | POA: Diagnosis not present

## 2024-01-27 DIAGNOSIS — R42 Dizziness and giddiness: Secondary | ICD-10-CM | POA: Diagnosis not present

## 2024-01-27 DIAGNOSIS — R21 Rash and other nonspecific skin eruption: Secondary | ICD-10-CM | POA: Diagnosis not present

## 2024-02-12 DIAGNOSIS — R2689 Other abnormalities of gait and mobility: Secondary | ICD-10-CM | POA: Diagnosis not present

## 2024-02-12 DIAGNOSIS — H53453 Other localized visual field defect, bilateral: Secondary | ICD-10-CM | POA: Diagnosis not present

## 2024-02-12 DIAGNOSIS — M3500 Sicca syndrome, unspecified: Secondary | ICD-10-CM | POA: Diagnosis not present

## 2024-02-12 DIAGNOSIS — R42 Dizziness and giddiness: Secondary | ICD-10-CM | POA: Diagnosis not present

## 2024-02-12 DIAGNOSIS — H35363 Drusen (degenerative) of macula, bilateral: Secondary | ICD-10-CM | POA: Diagnosis not present

## 2024-02-18 DIAGNOSIS — R42 Dizziness and giddiness: Secondary | ICD-10-CM | POA: Diagnosis not present

## 2024-03-16 DIAGNOSIS — N3941 Urge incontinence: Secondary | ICD-10-CM | POA: Diagnosis not present

## 2024-03-20 DIAGNOSIS — I10 Essential (primary) hypertension: Secondary | ICD-10-CM | POA: Diagnosis not present

## 2024-03-20 DIAGNOSIS — R42 Dizziness and giddiness: Secondary | ICD-10-CM | POA: Diagnosis not present

## 2024-03-20 DIAGNOSIS — I959 Hypotension, unspecified: Secondary | ICD-10-CM | POA: Diagnosis not present

## 2024-04-10 DIAGNOSIS — Z23 Encounter for immunization: Secondary | ICD-10-CM | POA: Diagnosis not present

## 2024-04-28 DIAGNOSIS — R7989 Other specified abnormal findings of blood chemistry: Secondary | ICD-10-CM | POA: Diagnosis not present

## 2024-04-28 DIAGNOSIS — R809 Proteinuria, unspecified: Secondary | ICD-10-CM | POA: Diagnosis not present

## 2024-04-28 DIAGNOSIS — D649 Anemia, unspecified: Secondary | ICD-10-CM | POA: Diagnosis not present

## 2024-04-28 DIAGNOSIS — Z6832 Body mass index (BMI) 32.0-32.9, adult: Secondary | ICD-10-CM | POA: Diagnosis not present

## 2024-04-28 DIAGNOSIS — Z1231 Encounter for screening mammogram for malignant neoplasm of breast: Secondary | ICD-10-CM | POA: Diagnosis not present

## 2024-04-28 DIAGNOSIS — E039 Hypothyroidism, unspecified: Secondary | ICD-10-CM | POA: Diagnosis not present

## 2024-04-28 DIAGNOSIS — G4733 Obstructive sleep apnea (adult) (pediatric): Secondary | ICD-10-CM | POA: Diagnosis not present

## 2024-04-28 DIAGNOSIS — I10 Essential (primary) hypertension: Secondary | ICD-10-CM | POA: Diagnosis not present

## 2024-04-28 DIAGNOSIS — Z6841 Body Mass Index (BMI) 40.0 and over, adult: Secondary | ICD-10-CM | POA: Diagnosis not present

## 2024-05-05 DIAGNOSIS — N179 Acute kidney failure, unspecified: Secondary | ICD-10-CM | POA: Diagnosis not present

## 2024-05-27 DIAGNOSIS — M3500 Sicca syndrome, unspecified: Secondary | ICD-10-CM | POA: Diagnosis not present

## 2024-05-27 DIAGNOSIS — M150 Primary generalized (osteo)arthritis: Secondary | ICD-10-CM | POA: Diagnosis not present

## 2024-05-27 DIAGNOSIS — M797 Fibromyalgia: Secondary | ICD-10-CM | POA: Diagnosis not present
